# Patient Record
Sex: Female | Born: 1980 | Race: Black or African American | Hispanic: No | Marital: Single | Smoking: Never smoker
Health system: Southern US, Community
[De-identification: ages and names within clinical notes are randomized; demographics above are authoritative.]

## PROBLEM LIST (undated history)

## (undated) DIAGNOSIS — F419 Anxiety disorder, unspecified: Secondary | ICD-10-CM

## (undated) DIAGNOSIS — Z8489 Family history of other specified conditions: Secondary | ICD-10-CM

## (undated) DIAGNOSIS — D649 Anemia, unspecified: Secondary | ICD-10-CM

## (undated) DIAGNOSIS — K219 Gastro-esophageal reflux disease without esophagitis: Secondary | ICD-10-CM

## (undated) DIAGNOSIS — E119 Type 2 diabetes mellitus without complications: Secondary | ICD-10-CM

## (undated) HISTORY — PX: SUPERFICIAL LYMPH NODE BIOPSY / EXCISION: SUR127

## (undated) HISTORY — DX: Type 2 diabetes mellitus without complications: E11.9

## (undated) HISTORY — PX: ESOPHAGOGASTRODUODENOSCOPY: SHX1529

## (undated) HISTORY — PX: GYNECOLOGIC CRYOSURGERY: SHX857

## (undated) HISTORY — PX: CHOLECYSTECTOMY: SHX55

---

## 2003-09-21 ENCOUNTER — Emergency Department (HOSPITAL_COMMUNITY): Admission: EM | Admit: 2003-09-21 | Discharge: 2003-09-21 | Payer: Self-pay | Admitting: Emergency Medicine

## 2004-03-21 ENCOUNTER — Emergency Department (HOSPITAL_COMMUNITY): Admission: EM | Admit: 2004-03-21 | Discharge: 2004-03-21 | Payer: Self-pay | Admitting: Emergency Medicine

## 2004-03-21 ENCOUNTER — Emergency Department (HOSPITAL_COMMUNITY): Admission: EM | Admit: 2004-03-21 | Discharge: 2004-03-22 | Payer: Self-pay | Admitting: Emergency Medicine

## 2004-07-12 ENCOUNTER — Other Ambulatory Visit: Admission: RE | Admit: 2004-07-12 | Discharge: 2004-07-12 | Payer: Self-pay | Admitting: Obstetrics and Gynecology

## 2004-08-02 ENCOUNTER — Encounter: Admission: RE | Admit: 2004-08-02 | Discharge: 2004-08-02 | Payer: Self-pay | Admitting: Family Medicine

## 2004-08-30 ENCOUNTER — Other Ambulatory Visit: Admission: RE | Admit: 2004-08-30 | Discharge: 2004-08-30 | Payer: Self-pay | Admitting: Obstetrics and Gynecology

## 2005-01-17 ENCOUNTER — Other Ambulatory Visit: Admission: RE | Admit: 2005-01-17 | Discharge: 2005-01-17 | Payer: Self-pay | Admitting: Obstetrics and Gynecology

## 2011-11-25 ENCOUNTER — Encounter (HOSPITAL_BASED_OUTPATIENT_CLINIC_OR_DEPARTMENT_OTHER): Payer: Self-pay | Admitting: Student

## 2011-11-25 ENCOUNTER — Emergency Department (HOSPITAL_BASED_OUTPATIENT_CLINIC_OR_DEPARTMENT_OTHER)
Admission: EM | Admit: 2011-11-25 | Discharge: 2011-11-25 | Disposition: A | Discharge status: Home / Self Care | Payer: Self-pay | Attending: Emergency Medicine | Admitting: Emergency Medicine

## 2011-11-25 DIAGNOSIS — E119 Type 2 diabetes mellitus without complications: Secondary | ICD-10-CM | POA: Insufficient documentation

## 2011-11-25 DIAGNOSIS — N390 Urinary tract infection, site not specified: Secondary | ICD-10-CM | POA: Insufficient documentation

## 2011-11-25 HISTORY — DX: Anemia, unspecified: D64.9

## 2011-11-25 LAB — URINALYSIS, ROUTINE W REFLEX MICROSCOPIC
Bilirubin Urine: NEGATIVE
Glucose, UA: >1000 mg/dL — AB
Ketones, ur: NEGATIVE mg/dL
Nitrite: NEGATIVE
Protein, ur: NEGATIVE mg/dL
Specific Gravity, Urine: 1.04 — ABNORMAL HIGH (ref 1.005–1.030)
Urobilinogen, UA: 0.2 mg/dL (ref 0.0–1.0)
pH: 6 (ref 5.0–8.0)

## 2011-11-25 LAB — BASIC METABOLIC PANEL
BUN: 9 mg/dL (ref 6–23)
CO2: 24 mEq/L (ref 19–32)
Calcium: 9.7 mg/dL (ref 8.4–10.5)
Chloride: 99 mEq/L (ref 96–112)
Creatinine, Ser: 0.6 mg/dL (ref 0.50–1.10)
GFR calc Af Amer: >90 mL/min (ref >90)
GFR calc non Af Amer: >90 mL/min (ref >90)
Glucose, Bld: 258 mg/dL — ABNORMAL HIGH (ref 70–99)
Potassium: 3.7 mEq/L (ref 3.5–5.1)
Sodium: 135 mEq/L (ref 135–145)

## 2011-11-25 LAB — CBC
HCT: 37.3 % (ref 36.0–46.0)
Hemoglobin: 13.1 g/dL (ref 12.0–15.0)
MCH: 29.6 pg (ref 26.0–34.0)
MCHC: 35.1 g/dL (ref 30.0–36.0)
MCV: 84.4 fL (ref 78.0–100.0)
Platelets: 266 10*3/uL (ref 150–400)
RBC: 4.42 MIL/uL (ref 3.87–5.11)
RDW: 12.1 % (ref 11.5–15.5)
WBC: 8.9 10*3/uL (ref 4.0–10.5)

## 2011-11-25 LAB — PREGNANCY, URINE: Preg Test, Ur: NEGATIVE

## 2011-11-25 LAB — URINE MICROSCOPIC-ADD ON

## 2011-11-25 LAB — GLUCOSE, CAPILLARY: Glucose-Capillary: 254 mg/dL — ABNORMAL HIGH (ref 70–99)

## 2011-11-25 MED ORDER — SODIUM CHLORIDE 0.9 % IV BOLUS (SEPSIS)
1000.0000 mL | Freq: Once | INTRAVENOUS | Status: AC
  Administered 2011-11-25: 1000 mL via INTRAVENOUS

## 2011-11-25 MED ORDER — CIPROFLOXACIN HCL 500 MG PO TABS: 500.0000 mg | ORAL_TABLET | Freq: Two times a day (BID) | ORAL | Status: AC

## 2011-11-25 MED ORDER — METFORMIN HCL 500 MG PO TABS
500.0000 mg | ORAL_TABLET | Freq: Once | ORAL | Status: AC
  Administered 2011-11-25: 500 mg via ORAL
  Filled 2011-11-25: qty 1

## 2011-11-25 MED ORDER — METFORMIN HCL 500 MG PO TABS: 500.0000 mg | ORAL_TABLET | Freq: Once | ORAL | Status: DC

## 2011-11-25 MED ORDER — CIPROFLOXACIN HCL 500 MG PO TABS
500.0000 mg | ORAL_TABLET | Freq: Once | ORAL | Status: AC
  Administered 2011-11-25: 500 mg via ORAL
  Filled 2011-11-25: qty 1

## 2011-11-25 NOTE — ED Notes (Signed)
Pt educated on importance of checking her blood glucose at home while on glucophage, pt verbalized understanding and stated that she had access to several glucometers. Pt to follow up with pcp in 2 days

## 2011-11-25 NOTE — Discharge Instructions (Signed)

## 2011-11-25 NOTE — ED Notes (Addendum)
Pt in with c/o feeling foggy, tired, reports increased urination with dysuria, increased thirst and reports blurred vision. Pt in with reported CBG of 419.

## 2011-11-25 NOTE — ED Provider Notes (Signed)
History   This chart was scribed for Nancy Bucco, MD by Sofie Rower. The patient was seen in room MH10/MH10 and the patient's care was started at 9:10 PM     CSN: [id number]  Arrival date & time 11/25/11  1953   First MD Initiated Contact with Patient 11/25/11 2052      Chief Complaint  Patient presents with  . Hyperglycemia  . Eye Problem    blurred vision    (Consider location/radiation/quality/duration/timing/severity/associated sxs/prior treatment) HPI  Nancy Nunez is a 31 y.o. female who presents to the Emergency Department complaining of moderate, constant hyperglycemia onset three days ago with associated symptoms of nausea, headaches, thirst, increased urinary frequency. The pt states "she has never had any problems with her blood sugar before." Pt has a hx of anemia.   Pt denies fever, cough, congestion.   PCP is Dr. Coralee Pesa in [place], [place].    Past Medical History  Diagnosis Date  . Anemia     Past Surgical History  Procedure Date  . Superficial lymph node biopsy / excision       History  Substance Use Topics  . Smoking status: Never Smoker   . Smokeless tobacco: Not on file  . Alcohol Use: No    OB History    Grav Para Term Preterm Abortions TAB SAB Ect Mult Living                  Review of Systems  Constitutional: Negative for fever, chills, diaphoresis and fatigue.  HENT: Negative for congestion, rhinorrhea and sneezing.   Eyes: Negative.   Respiratory: Negative for cough, chest tightness and shortness of breath.   Cardiovascular: Negative for chest pain and leg swelling.  Gastrointestinal: Negative for nausea, vomiting, abdominal pain, diarrhea and blood in stool.  Genitourinary: Negative for frequency, hematuria, flank pain and difficulty urinating.  Musculoskeletal: Negative for back pain and arthralgias.  Skin: Negative for rash.  Neurological: Negative for dizziness, speech difficulty, weakness, numbness and headaches.     Allergies  Bee venom and Pineapple  Home Medications   Current Outpatient Rx  Name Route Sig Dispense Refill  . ACETAMINOPHEN 325 MG PO TABS Oral Take by mouth every 6 (six) hours as needed. Patient used this medication for her headache.    Marland Kitchen LORATADINE 10 MG PO TABS Oral Take 10 mg by mouth daily. Patient is using this medication for her allergies.    Marland Kitchen METFORMIN HCL 500 MG PO TABS Oral Take 1 tablet (500 mg total) by mouth once. 30 tablet 0    BP 131/74  Pulse 90  Temp 98.1 F (36.7 C) (Oral)  Resp 20  Wt 178 lb (80.74 kg)  SpO2 100%  LMP 11/02/2011  Physical Exam  Nursing note and vitals reviewed. Constitutional: She is oriented to person, place, and time. She appears well-developed and well-nourished.  HENT:  Head: Normocephalic and atraumatic.  Eyes: Pupils are equal, round, and reactive to light.  Neck: Normal range of motion. Neck supple.  Cardiovascular: Normal rate, regular rhythm and normal heart sounds.   Pulmonary/Chest: Effort normal and breath sounds normal. No respiratory distress. She has no wheezes. She has no rales. She exhibits no tenderness.  Abdominal: Soft. Bowel sounds are normal. There is no tenderness. There is no rebound and no guarding.  Musculoskeletal: Normal range of motion. She exhibits no edema.  Lymphadenopathy:    She has no cervical adenopathy.  Neurological: She is alert and oriented to person, place, and time.  Skin: Skin is warm and dry. No rash noted.  Psychiatric: She has a normal mood and affect.    ED Course  Procedures (including critical care time)  DIAGNOSTIC STUDIES: Oxygen Saturation is 100% on room air, normal by my interpretation.    COORDINATION OF CARE:  9:12PM- EDP at bedside discusses treatment plan concerning blood work and diabetes management.    Results for orders placed during the hospital encounter of 11/25/11  GLUCOSE, CAPILLARY      Component Value Range   Glucose-Capillary 254 (*) 70 - 99 mg/dL    Comment 1 Notify RN     Comment 2 Documented in Chart    CBC      Component Value Range   WBC 8.9  4.0 - 10.5 K/uL   RBC 4.42  3.87 - 5.11 MIL/uL   Hemoglobin 13.1  12.0 - 15.0 g/dL   HCT 16.1  09.6 - 04.5 %   MCV 84.4  78.0 - 100.0 fL   MCH 29.6  26.0 - 34.0 pg   MCHC 35.1  30.0 - 36.0 g/dL   RDW 40.9  81.1 - 91.4 %   Platelets 266  150 - 400 K/uL  BASIC METABOLIC PANEL      Component Value Range   Sodium 135  135 - 145 mEq/L   Potassium 3.7  3.5 - 5.1 mEq/L   Chloride 99  96 - 112 mEq/L   CO2 24  19 - 32 mEq/L   Glucose, Bld 258 (*) 70 - 99 mg/dL   BUN 9  6 - 23 mg/dL   Creatinine, Ser 7.82  0.50 - 1.10 mg/dL   Calcium 9.7  8.4 - 95.6 mg/dL   GFR calc non Af Amer >90  >90 mL/min   GFR calc Af Amer >90  >90 mL/min  URINALYSIS, ROUTINE W REFLEX MICROSCOPIC      Component Value Range   Color, Urine YELLOW  YELLOW   APPearance CLOUDY (*) CLEAR   Specific Gravity, Urine 1.040 (*) 1.005 - 1.030   pH 6.0  5.0 - 8.0   Glucose, UA >1000 (*) NEGATIVE mg/dL   Hgb urine dipstick TRACE (*) NEGATIVE   Bilirubin Urine NEGATIVE  NEGATIVE   Ketones, ur NEGATIVE  NEGATIVE mg/dL   Protein, ur NEGATIVE  NEGATIVE mg/dL   Urobilinogen, UA 0.2  0.0 - 1.0 mg/dL   Nitrite NEGATIVE  NEGATIVE   Leukocytes, UA MODERATE (*) NEGATIVE  PREGNANCY, URINE      Component Value Range   Preg Test, Ur NEGATIVE  NEGATIVE  URINE MICROSCOPIC-ADD ON      Component Value Range   Squamous Epithelial / LPF RARE  RARE   WBC, UA 11-20  <3 WBC/hpf   RBC / HPF 0-2  <3 RBC/hpf   Bacteria, UA FEW (*) RARE   No results found.    No results found.   1. Diabetes mellitus, new onset   2. UTI (lower urinary tract infection)       MDM  Pt with new onset diabetes, well appearing, no evidence of DKA.  Spoke with PA on call for her PMD, Dr. Coralee Pesa who will see pt in office within the next couple of days.  Will start pt on metformin per her recommendation.  Advised pt to check BS frequently over next few days,  has monitor at home due to family member with DM      I personally performed the services described in this documentation, which was scribed in  my presence.  The recorded information has been reviewed and considered.    Nancy Bucco, MD 11/25/11 2140

## 2013-09-16 ENCOUNTER — Ambulatory Visit: Payer: Self-pay

## 2013-09-23 ENCOUNTER — Ambulatory Visit: Payer: Self-pay

## 2013-10-07 ENCOUNTER — Ambulatory Visit (INDEPENDENT_AMBULATORY_CARE_PROVIDER_SITE_OTHER): Payer: BC Managed Care – PPO

## 2013-10-07 ENCOUNTER — Encounter: Payer: Self-pay | Admitting: *Deleted

## 2013-10-07 VITALS — BP 120/76 | HR 87 | Resp 12

## 2013-10-07 DIAGNOSIS — E1142 Type 2 diabetes mellitus with diabetic polyneuropathy: Secondary | ICD-10-CM

## 2013-10-07 DIAGNOSIS — R52 Pain, unspecified: Secondary | ICD-10-CM

## 2013-10-07 DIAGNOSIS — G5761 Lesion of plantar nerve, right lower limb: Secondary | ICD-10-CM

## 2013-10-07 DIAGNOSIS — G5760 Lesion of plantar nerve, unspecified lower limb: Secondary | ICD-10-CM

## 2013-10-07 DIAGNOSIS — E1149 Type 2 diabetes mellitus with other diabetic neurological complication: Secondary | ICD-10-CM

## 2013-10-07 DIAGNOSIS — E1140 Type 2 diabetes mellitus with diabetic neuropathy, unspecified: Secondary | ICD-10-CM

## 2013-10-07 MED ORDER — MELOXICAM 15 MG PO TABS: 15.0000 mg | ORAL_TABLET | Freq: Every day | ORAL | Status: DC

## 2013-10-07 NOTE — Patient Instructions (Signed)
ICE INSTRUCTIONS  Apply ice or cold pack to the affected area at least 3 times a day for 10-15 minutes each time.  You should also use ice after prolonged activity or vigorous exercise.  Do not apply ice longer than 20 minutes at one time.  Always keep a cloth between your skin and the ice pack to prevent burns.  Being consistent and following these instructions will help control your symptoms.  We suggest you purchase a gel ice pack because they are reusable and do bit leak.  Some of them are designed to wrap around the area.  Use the method that works best for you.  Here are some other suggestions for icing.   Use a frozen bag of peas or corn-inexpensive and molds well to your body, usually stays frozen for 10 to 20 minutes.  Wet a towel with cold water and squeeze out the excess until it's damp.  Place in a bag in the freezer for 20 minutes. Then remove and use.    Diabetes and Foot Care Diabetes may cause you to have problems because of poor blood supply (circulation) to your feet and legs. This may cause the skin on your feet to become thinner, break easier, and heal more slowly. Your skin may become dry, and the skin may peel and crack. You may also have nerve damage in your legs and feet causing decreased feeling in them. You may not notice minor injuries to your feet that could lead to infections or more serious problems. Taking care of your feet is one of the most important things you can do for yourself.  HOME CARE INSTRUCTIONS  Wear shoes at all times, even in the house. Do not go barefoot. Bare feet are easily injured.  Check your feet daily for blisters, cuts, and redness. If you cannot see the bottom of your feet, use a mirror or ask someone for help.  Wash your feet with warm water (do not use hot water) and mild soap. Then pat your feet and the areas between your toes until they are completely dry. Do not soak your feet as this can dry your skin.  Apply a moisturizing lotion or  petroleum jelly (that does not contain alcohol and is unscented) to the skin on your feet and to dry, brittle toenails. Do not apply lotion between your toes.  Trim your toenails straight across. Do not dig under them or around the cuticle. File the edges of your nails with an emery board or nail file.  Do not cut corns or calluses or try to remove them with medicine.  Wear clean socks or stockings every day. Make sure they are not too tight. Do not wear knee-high stockings since they may decrease blood flow to your legs.  Wear shoes that fit properly and have enough cushioning. To break in new shoes, wear them for just a few hours a day. This prevents you from injuring your feet. Always look in your shoes before you put them on to be sure there are no objects inside.  Do not cross your legs. This may decrease the blood flow to your feet.  If you find a minor scrape, cut, or break in the skin on your feet, keep it and the skin around it clean and dry. These areas may be cleansed with mild soap and water. Do not cleanse the area with peroxide, alcohol, or iodine.  When you remove an adhesive bandage, be sure not to damage the skin around  it.  If you have a wound, look at it several times a day to make sure it is healing.  Do not use heating pads or hot water bottles. They may burn your skin. If you have lost feeling in your feet or legs, you may not know it is happening until it is too late.  Make sure your health care provider performs a complete foot exam at least annually or more often if you have foot problems. Report any cuts, sores, or bruises to your health care provider immediately. SEEK MEDICAL CARE IF:   You have an injury that is not healing.  You have cuts or breaks in the skin.  You have an ingrown nail.  You notice redness on your legs or feet.  You feel burning or tingling in your legs or feet.  You have pain or cramps in your legs and feet.  Your legs or feet are  numb.  Your feet always feel cold. SEEK IMMEDIATE MEDICAL CARE IF:   There is increasing redness, swelling, or pain in or around a wound.  There is a red line that goes up your leg.  Pus is coming from a wound.  You develop a fever or as directed by your health care provider.  You notice a bad smell coming from an ulcer or wound. Document Released: 05/24/2000 Document Revised: 01/27/2013 Document Reviewed: 11/03/2012 [facility] Patient Information 2014 [place].  Shoe recommendations maintain a stiff or firm soled athletic shoe lace up oxford type shoe avoid any flimsy shoes barefoot or flip-flops. Crocs make an excellent shoe for around the house

## 2013-10-07 NOTE — Progress Notes (Signed)
   Subjective:    Patient ID: Nancy Nunez, female    DOB: [birth year], 33 y.o.   MRN: [id number]  HPI  PT STATED RT BALL OF THE FOOT HAVING BURNING SENSATION AND PAINFUL FOR 5 MONTHS. THE FOOT IS MUCH BETTER. THE FOOT GET AGGRAVATED BY WALKING AND PUTTING PRESSURE ON IT. TRIED IBUPROFEN AND GOOD SHOES AND IT HELPS.    Review of Systems  All other systems reviewed and are negative.      Objective:   Physical Exam 33 year old [place] American female presents this time with a six-month history of pain burning in the ball of the right foot motion objective findings as follows well-developed well-nourished returns 3 has palpable pedal pulses DP postal for PT plus one over 4 capillary refill time 3 seconds all digits skin temperature warm turgor normal no edema rubor pallor or varicosities noted. Neurologically epicritic and proprioceptive sensations intact and symmetric bilateral there is normal plantar response DTRs not elicited dermatologically skin color pigment normal orthopedic biomechanical exam there is pain on palpation second MTP and second interspace right on direct lateral compression x-rays reveal signs of fracture no osseous abnormality mild flexible digital contractures are noted. Neurologically patient may have some early diabetic neuropathy symptoms however burning of the right facility exacerbation at this time contralateral left foot is asymptomatic. Patient is currently wearing a very flimsy and soled ballet take slipper that has no structure support and is at least 1 inch to narrow for her foot     Assessment & Plan:  Assessment this time is possible mild early diabetic neuropathy however exacerbation of second interspace Morton's neuroma is evident on direct lateral compression. This time injection tender with Kenalog 20 mg Xylocaine plain infiltrated to the second intermetatarsal space providing some relief recommend ice and meloxicam or MOBIC NSAID therapy reappointed in 3-4  weeks for followup recommend maintain wide stiff soled shoe at all times no barefoot or flimsy shoes or tight constrictive shoes. Next  Harriet Masson DPM

## 2013-10-14 ENCOUNTER — Ambulatory Visit: Payer: BC Managed Care – PPO

## 2013-10-18 ENCOUNTER — Telehealth: Payer: Self-pay | Admitting: *Deleted

## 2013-10-18 ENCOUNTER — Encounter: Payer: Self-pay | Admitting: *Deleted

## 2013-10-18 NOTE — Telephone Encounter (Signed)
He put me on light duty for a week.  My foot has gotten worse.  Please call me, I have 2 questions.

## 2013-10-18 NOTE — Telephone Encounter (Signed)
Called patient to see if I could help her and patient stated that someone has already helped her with the questions/lisa cox

## 2013-10-27 ENCOUNTER — Encounter: Payer: Self-pay | Admitting: *Deleted

## 2013-10-27 ENCOUNTER — Ambulatory Visit (INDEPENDENT_AMBULATORY_CARE_PROVIDER_SITE_OTHER): Payer: BC Managed Care – PPO

## 2013-10-27 VITALS — BP 110/62 | HR 96 | Resp 18

## 2013-10-27 DIAGNOSIS — R52 Pain, unspecified: Secondary | ICD-10-CM

## 2013-10-27 DIAGNOSIS — E1140 Type 2 diabetes mellitus with diabetic neuropathy, unspecified: Secondary | ICD-10-CM

## 2013-10-27 DIAGNOSIS — G5761 Lesion of plantar nerve, right lower limb: Secondary | ICD-10-CM

## 2013-10-27 DIAGNOSIS — G5760 Lesion of plantar nerve, unspecified lower limb: Secondary | ICD-10-CM

## 2013-10-27 MED ORDER — GABAPENTIN 300 MG PO CAPS: 300.0000 mg | ORAL_CAPSULE | Freq: Every day | ORAL | Status: DC

## 2013-10-27 NOTE — Patient Instructions (Signed)

## 2013-10-27 NOTE — Progress Notes (Signed)
   Subjective:    Patient ID: Nancy Nunez, female    DOB: 1981-01-29, 33 y.o.   MRN: [id number]  HPI last week was bad on my right foot and this week is a little better but not well and I have iced it and kept it up and it seems to have become sensitive on top of my foot    Review of Systems no new systemic changes or findings to     Objective:   Physical Exam Neurovascular status is intact pedal pulses are palpable epicritic and proprioceptive sensations intact patient has hyperesthesia of the right foot of all interspace and 00 forefoot in general second third and fourth interspaces tender on direct lateral compression consistent with neuroma versus diabetic peripheral neuropathy. Left foot is relatively asymptomatic patient is actually recently put on the medications which may be causing the neuropathy exacerbation or exacerbation of her neuroma patient also brought in her tennis shoes she is wearing [employer] classic which is in a curved last and may be causing some aggravation the shoes are also too narrow for foot recommended a wider shoe with a straight last such as a new balance or Brooks. The injection provide some temporary relief however has flared back up since then continues to have paresthesias burning and shooting sensation in her right foot.       Assessment & Plan:  Assessment diabetic peripheral neuropathy as well as possibly exacerbation of early Morton's neuroma right foot second third interspaces plan at this time patient will continue with warm compress ice pack application as recommended at this time using heat and cold as well as recommended wider accommodative shoes avoiding concur plan inferolateral such as the reduction is wearing a return to work activities for exercise swimming is recommended if available. Patient also at this time is placed on a trial of gabapentin 3 mg each bedtime recheck in one month for followup and reevaluation discussed the risks indications  alternatives side effects or medications she understands the the sleeping a side effect indicates her mother was done Lyrica and had suicidal thoughts advising assessment possible potential side effect In an individual and she is to watch for in symptomology. Followup in one month as recommended  Harriet Masson DPM

## 2013-11-25 ENCOUNTER — Ambulatory Visit: Payer: BC Managed Care – PPO

## 2014-11-01 ENCOUNTER — Telehealth: Payer: Self-pay | Admitting: *Deleted

## 2014-11-01 NOTE — Telephone Encounter (Signed)
We are working on getting a cd for the patient. Nancy Nunez

## 2019-01-04 DIAGNOSIS — E1165 Type 2 diabetes mellitus with hyperglycemia: Secondary | ICD-10-CM

## 2019-01-04 HISTORY — DX: Type 2 diabetes mellitus with hyperglycemia: E11.65

## 2020-10-17 ENCOUNTER — Other Ambulatory Visit: Payer: Self-pay | Admitting: Anesthesiology

## 2020-10-17 DIAGNOSIS — G8911 Acute pain due to trauma: Secondary | ICD-10-CM

## 2020-11-07 ENCOUNTER — Ambulatory Visit (HOSPITAL_COMMUNITY)
Admission: RE | Admit: 2020-11-07 | Discharge: 2020-11-07 | Disposition: A | Discharge status: Home / Self Care | Payer: Commercial Managed Care - PPO | Source: Ambulatory Visit | Attending: Anesthesiology | Admitting: Anesthesiology

## 2020-11-07 ENCOUNTER — Other Ambulatory Visit: Payer: Self-pay

## 2020-11-07 DIAGNOSIS — G8911 Acute pain due to trauma: Secondary | ICD-10-CM | POA: Diagnosis present

## 2020-12-08 DIAGNOSIS — U071 COVID-19: Secondary | ICD-10-CM | POA: Insufficient documentation

## 2020-12-08 HISTORY — DX: COVID-19: U07.1

## 2021-04-09 ENCOUNTER — Other Ambulatory Visit: Payer: Self-pay | Admitting: *Deleted

## 2021-04-09 ENCOUNTER — Encounter: Payer: Self-pay | Admitting: *Deleted

## 2021-04-09 ENCOUNTER — Ambulatory Visit (INDEPENDENT_AMBULATORY_CARE_PROVIDER_SITE_OTHER): Payer: Commercial Managed Care - PPO

## 2021-04-09 DIAGNOSIS — R002 Palpitations: Secondary | ICD-10-CM

## 2021-04-09 NOTE — Progress Notes (Unsigned)
Patient ID: Nancy Nunez, female   DOB: 1981/03/05, 40 y.o.   MRN: [id number] Patient enrolled for [employer] to mail a 14 day ZIO XT monitor to her address on file. Letter with instructions mailed to patient.

## 2021-04-09 NOTE — Progress Notes (Unsigned)
Patient enrolled for [employer] to mail a 14 day ZIO XT monitor to her address on file. Letter with instructions mailed to patient.

## 2021-04-13 DIAGNOSIS — R002 Palpitations: Secondary | ICD-10-CM | POA: Diagnosis not present

## 2021-06-26 ENCOUNTER — Other Ambulatory Visit: Payer: Self-pay | Admitting: Internal Medicine

## 2021-06-26 ENCOUNTER — Encounter: Payer: Self-pay | Admitting: *Deleted

## 2021-06-26 DIAGNOSIS — R002 Palpitations: Secondary | ICD-10-CM

## 2021-06-26 NOTE — Progress Notes (Signed)
Patient ID: Nancy Nunez, female   DOB: 1980/12/25, 41 y.o.   MRN: [id number] Patient enrolled for [employer] to ship a 30 day cardiac event monitor to her address ,([address]), on file. Letter with instructions mailed to patient.

## 2021-09-19 NOTE — Progress Notes (Signed)
Event Monitor was never applied/canceled. Order will be canceled out of the workque

## 2021-10-04 ENCOUNTER — Other Ambulatory Visit: Payer: Self-pay | Admitting: Otolaryngology

## 2021-10-04 DIAGNOSIS — D37030 Neoplasm of uncertain behavior of the parotid salivary glands: Secondary | ICD-10-CM

## 2021-10-29 ENCOUNTER — Telehealth: Payer: Self-pay | Admitting: Otolaryngology

## 2021-10-29 NOTE — Telephone Encounter (Signed)
10/29/21~Per [facility], order is incorrect...need CT w / Called office, s/w Emily/[phone] (direct #)will fax corrected order. MF

## 2021-10-31 ENCOUNTER — Ambulatory Visit: Admission: RE | Admit: 2021-10-31 | Payer: Commercial Managed Care - PPO | Source: Ambulatory Visit

## 2021-11-12 ENCOUNTER — Ambulatory Visit: Payer: Commercial Managed Care - PPO

## 2021-11-16 ENCOUNTER — Ambulatory Visit
Admission: RE | Admit: 2021-11-16 | Discharge: 2021-11-16 | Disposition: A | Discharge status: Home / Self Care | Payer: Commercial Managed Care - PPO | Source: Ambulatory Visit | Attending: Otolaryngology | Admitting: Otolaryngology

## 2021-11-16 DIAGNOSIS — D37030 Neoplasm of uncertain behavior of the parotid salivary glands: Secondary | ICD-10-CM | POA: Diagnosis present

## 2021-11-16 LAB — POCT I-STAT CREATININE: Creatinine, Ser: 0.5 mg/dL (ref 0.44–1.00)

## 2021-11-16 MED ORDER — IOHEXOL 300 MG/ML  SOLN
75.0000 mL | Freq: Once | INTRAMUSCULAR | Status: AC | PRN
  Administered 2021-11-16: 75 mL via INTRAVENOUS

## 2021-11-28 ENCOUNTER — Other Ambulatory Visit: Payer: Self-pay | Admitting: Otolaryngology

## 2021-11-28 DIAGNOSIS — K118 Other diseases of salivary glands: Secondary | ICD-10-CM

## 2021-11-30 ENCOUNTER — Other Ambulatory Visit: Payer: Self-pay | Admitting: Physician Assistant

## 2021-12-04 ENCOUNTER — Ambulatory Visit
Admission: RE | Admit: 2021-12-04 | Discharge: 2021-12-04 | Disposition: A | Discharge status: Home / Self Care | Payer: Commercial Managed Care - PPO | Source: Ambulatory Visit | Attending: Otolaryngology | Admitting: Otolaryngology

## 2021-12-04 ENCOUNTER — Other Ambulatory Visit: Payer: Self-pay | Admitting: Otolaryngology

## 2021-12-04 DIAGNOSIS — D37030 Neoplasm of uncertain behavior of the parotid salivary glands: Secondary | ICD-10-CM | POA: Diagnosis present

## 2021-12-04 DIAGNOSIS — K118 Other diseases of salivary glands: Secondary | ICD-10-CM

## 2021-12-04 NOTE — Procedures (Signed)
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  Three core samples obtained and sent to Pathology.  Jodi Marble. Fredia Sorrow, M.D Pager:  [phone]

## 2021-12-05 LAB — SURGICAL PATHOLOGY

## 2022-02-07 ENCOUNTER — Encounter
Admission: RE | Admit: 2022-02-07 | Discharge: 2022-02-07 | Disposition: A | Discharge status: Home / Self Care | Payer: Commercial Managed Care - PPO | Source: Ambulatory Visit | Attending: Otolaryngology | Admitting: Otolaryngology

## 2022-02-07 VITALS — Ht 64.5 in | Wt 180.0 lb

## 2022-02-07 DIAGNOSIS — Z01818 Encounter for other preprocedural examination: Secondary | ICD-10-CM

## 2022-02-07 DIAGNOSIS — E119 Type 2 diabetes mellitus without complications: Secondary | ICD-10-CM

## 2022-02-07 HISTORY — DX: Anxiety disorder, unspecified: F41.9

## 2022-02-07 HISTORY — DX: Gastro-esophageal reflux disease without esophagitis: K21.9

## 2022-02-07 HISTORY — DX: Family history of other specified conditions: Z84.89

## 2022-02-07 NOTE — Patient Instructions (Signed)
Your procedure is scheduled on: 02/13/22 Report to [facility]. To find out your arrival time please call [phone] between 1PM - 3PM on 02/12/22.  Remember: Instructions that are not followed completely may result in serious medical risk, up to and including death, or upon the discretion of your surgeon and anesthesiologist your surgery may need to be rescheduled.     _X__ 1. Do not eat food after midnight the night before your procedure.                 No gum chewing or hard candies. You may drink clear liquids up to 2 hours                 before you are scheduled to arrive for your surgery- DO not drink clear                 liquids within 2 hours of the start of your surgery.                 Diabetics water only  __X__2.  On the morning of surgery brush your teeth with toothpaste and water, you                 may rinse your mouth with mouthwash if you wish.  Do not swallow any              toothpaste of mouthwash.     _X__ 3.  No Alcohol for 24 hours before or after surgery.   _X__ 4.  Do Not Smoke or use e-cigarettes For 24 Hours Prior to Your Surgery.                 Do not use any chewable tobacco products for at least 6 hours prior to                 surgery.  ____  5.  Bring all medications with you on the day of surgery if instructed.   __X__  6.  Notify your doctor if there is any change in your medical condition      (cold, fever, infections).     Do not wear jewelry, make-up, hairpins, clips or nail polish. Do not wear lotions, powders, or perfumes.  Do not shave 48 hours prior to surgery. Men may shave face and neck. Do not bring valuables to the hospital.    [facility] is not responsible for any belongings or valuables.  Contacts, dentures/partials or body piercings may not be worn into surgery. Bring a case for your contacts, glasses or hearing aids, a denture cup will be supplied. Leave your suitcase in the  car. After surgery it may be brought to your room. For patients admitted to the hospital, discharge time is determined by your treatment team.   Patients discharged the day of surgery will not be allowed to drive home.    __X__ Take these medicines the morning of surgery with A SIP OF WATER:    1. famotidine (PEPCID) 20 MG tablet  2.   3.   4.  5.  6.  ____ Fleet Enema (as directed)   ____ Use CHG Soap/SAGE wipes as directed  __X__ Use inhalers on the day of surgery  __x__ Stop metformin/Janumet/Farxiga 2 days prior to surgery   __X__ Take 1/2 of usual insulin dose the night before surgery. Decrease Lantus to 18 units the night before surgery No insulin  the morning          of surgery.   ____ Stop Blood Thinners Coumadin/Plavix/Xarelto/Pleta/Pradaxa/Eliquis/Effient/Aspirin  on   Or contact your Surgeon, Cardiologist or Medical Doctor regarding  ability to stop your blood thinners  __X__ Stop Anti-inflammatories 7 days before surgery such as Advil, Ibuprofen, Motrin,  BC or Goodies Powder, Naprosyn, Naproxen, Aleve, Aspirin    __X__ Stop all herbals and supplements, fish oil or vitamins for 7 days until after surgery.    ____ Bring C-Pap to the hospital.    Stop Ozempic 7 days before surgery

## 2022-02-13 ENCOUNTER — Ambulatory Visit: Payer: Commercial Managed Care - PPO | Admitting: Certified Registered"

## 2022-02-13 ENCOUNTER — Other Ambulatory Visit: Payer: Self-pay

## 2022-02-13 ENCOUNTER — Observation Stay
Admission: RE | Admit: 2022-02-13 | Discharge: 2022-02-14 | Disposition: A | Discharge status: Home / Self Care | Payer: Commercial Managed Care - PPO | Attending: Otolaryngology | Admitting: Otolaryngology

## 2022-02-13 ENCOUNTER — Encounter: Payer: Self-pay | Admitting: Otolaryngology

## 2022-02-13 ENCOUNTER — Encounter
Admission: RE | Disposition: A | Discharge status: Home / Self Care | Payer: Self-pay | Source: Home / Self Care | Attending: Otolaryngology

## 2022-02-13 DIAGNOSIS — Z01818 Encounter for other preprocedural examination: Secondary | ICD-10-CM

## 2022-02-13 DIAGNOSIS — Z794 Long term (current) use of insulin: Secondary | ICD-10-CM

## 2022-02-13 DIAGNOSIS — D37030 Neoplasm of uncertain behavior of the parotid salivary glands: Secondary | ICD-10-CM | POA: Diagnosis present

## 2022-02-13 DIAGNOSIS — Z8616 Personal history of COVID-19: Secondary | ICD-10-CM | POA: Insufficient documentation

## 2022-02-13 DIAGNOSIS — Z91040 Latex allergy status: Secondary | ICD-10-CM | POA: Insufficient documentation

## 2022-02-13 DIAGNOSIS — Z91010 Allergy to peanuts: Secondary | ICD-10-CM | POA: Insufficient documentation

## 2022-02-13 DIAGNOSIS — E119 Type 2 diabetes mellitus without complications: Secondary | ICD-10-CM | POA: Diagnosis not present

## 2022-02-13 DIAGNOSIS — D351 Benign neoplasm of parathyroid gland: Principal | ICD-10-CM | POA: Insufficient documentation

## 2022-02-13 HISTORY — PX: PAROTIDECTOMY: SHX2163

## 2022-02-13 HISTORY — DX: Neoplasm of uncertain behavior of the parotid salivary glands: D37.030

## 2022-02-13 LAB — GLUCOSE, CAPILLARY
Glucose-Capillary: 156 mg/dL — ABNORMAL HIGH (ref 70–99)
Glucose-Capillary: 202 mg/dL — ABNORMAL HIGH (ref 70–99)

## 2022-02-13 LAB — POCT PREGNANCY, URINE: Preg Test, Ur: NEGATIVE

## 2022-02-13 SURGERY — EXCISION, PAROTID GLAND
Anesthesia: General | Site: Face | Laterality: Left

## 2022-02-13 MED ORDER — ONDANSETRON HCL 4 MG/2ML IJ SOLN
INTRAMUSCULAR | Status: DC | PRN
  Administered 2022-02-13: 65 mg via INTRAVENOUS

## 2022-02-13 MED ORDER — INSULIN GLARGINE-YFGN 100 UNIT/ML ~~LOC~~ SOLN
36.0000 [IU] | Freq: Every day | SUBCUTANEOUS | Status: DC
  Administered 2022-02-13: 36 [IU] via SUBCUTANEOUS
  Filled 2022-02-13: qty 0.36

## 2022-02-13 MED ORDER — LIDOCAINE-EPINEPHRINE (PF) 1 %-1:200000 IJ SOLN
INTRAMUSCULAR | Status: DC | PRN
  Administered 2022-02-13: 10 mL

## 2022-02-13 MED ORDER — HYDROCODONE-ACETAMINOPHEN 5-325 MG PO TABS
1.0000 | ORAL_TABLET | ORAL | Status: DC | PRN
  Administered 2022-02-13 – 2022-02-14 (×2): 1 via ORAL
  Filled 2022-02-13 (×2): qty 1

## 2022-02-13 MED ORDER — PHENYLEPHRINE 80 MCG/ML (10ML) SYRINGE FOR IV PUSH (FOR BLOOD PRESSURE SUPPORT): PREFILLED_SYRINGE | INTRAVENOUS | Status: DC | PRN

## 2022-02-13 MED ORDER — PROPOFOL 10 MG/ML IV BOLUS
INTRAVENOUS | Status: DC | PRN
  Administered 2022-02-13: 150 mg via INTRAVENOUS

## 2022-02-13 MED ORDER — PROPOFOL 1000 MG/100ML IV EMUL
INTRAVENOUS | Status: AC
  Filled 2022-02-13: qty 100

## 2022-02-13 MED ORDER — ONDANSETRON HCL 4 MG/2ML IJ SOLN
4.0000 mg | Freq: Four times a day (QID) | INTRAMUSCULAR | Status: DC | PRN
  Administered 2022-02-13: 4 mg via INTRAVENOUS
  Filled 2022-02-13: qty 2

## 2022-02-13 MED ORDER — ALBUTEROL SULFATE (2.5 MG/3ML) 0.083% IN NEBU: 3.0000 mL | INHALATION_SOLUTION | Freq: Four times a day (QID) | RESPIRATORY_TRACT | Status: DC | PRN

## 2022-02-13 MED ORDER — LACTATED RINGERS IV SOLN: INTRAVENOUS | Status: DC | PRN

## 2022-02-13 MED ORDER — FENTANYL CITRATE (PF) 100 MCG/2ML IJ SOLN
25.0000 ug | INTRAMUSCULAR | Status: DC | PRN
  Administered 2022-02-13 (×3): 25 ug via INTRAVENOUS

## 2022-02-13 MED ORDER — MAGNESIUM HYDROXIDE 400 MG/5ML PO SUSP: 30.0000 mL | Freq: Every day | ORAL | Status: DC | PRN

## 2022-02-13 MED ORDER — CHLORHEXIDINE GLUCONATE 0.12 % MT SOLN
15.0000 mL | Freq: Once | OROMUCOSAL | Status: AC
Start: 2022-02-13 — End: 2022-02-13

## 2022-02-13 MED ORDER — ACETAMINOPHEN 650 MG RE SUPP: 650.0000 mg | Freq: Four times a day (QID) | RECTAL | Status: DC | PRN

## 2022-02-13 MED ORDER — CYCLOBENZAPRINE HCL 10 MG PO TABS: 10.0000 mg | ORAL_TABLET | Freq: Three times a day (TID) | ORAL | Status: DC | PRN

## 2022-02-13 MED ORDER — LIDOCAINE HCL (CARDIAC) PF 100 MG/5ML IV SOSY
PREFILLED_SYRINGE | INTRAVENOUS | Status: DC | PRN
  Administered 2022-02-13: 50 mg via INTRAVENOUS

## 2022-02-13 MED ORDER — FENTANYL CITRATE (PF) 100 MCG/2ML IJ SOLN
INTRAMUSCULAR | Status: AC
  Filled 2022-02-13: qty 2

## 2022-02-13 MED ORDER — HEMOSTATIC AGENTS (NO CHARGE) OPTIME
TOPICAL | Status: DC | PRN
  Administered 2022-02-13: 1 via TOPICAL

## 2022-02-13 MED ORDER — FAMOTIDINE 20 MG PO TABS
ORAL_TABLET | ORAL | Status: AC
  Administered 2022-02-13: 20 mg via ORAL
  Filled 2022-02-13: qty 1

## 2022-02-13 MED ORDER — BISACODYL 5 MG PO TBEC: 5.0000 mg | DELAYED_RELEASE_TABLET | Freq: Every day | ORAL | Status: DC | PRN

## 2022-02-13 MED ORDER — ONDANSETRON 4 MG PO TBDP: 4.0000 mg | ORAL_TABLET | Freq: Four times a day (QID) | ORAL | Status: DC | PRN

## 2022-02-13 MED ORDER — KETAMINE HCL 50 MG/5ML IJ SOSY
PREFILLED_SYRINGE | INTRAMUSCULAR | Status: AC
Start: 2022-02-13 — End: ?
  Filled 2022-02-13: qty 5

## 2022-02-13 MED ORDER — FENTANYL CITRATE (PF) 100 MCG/2ML IJ SOLN
INTRAMUSCULAR | Status: AC
  Administered 2022-02-13: 25 ug via INTRAVENOUS
  Filled 2022-02-13: qty 2

## 2022-02-13 MED ORDER — LIDOCAINE-EPINEPHRINE (PF) 1 %-1:200000 IJ SOLN
INTRAMUSCULAR | Status: AC
  Filled 2022-02-13: qty 30

## 2022-02-13 MED ORDER — SODIUM CHLORIDE 0.45 % IV SOLN: INTRAVENOUS | Status: DC

## 2022-02-13 MED ORDER — FLEET ENEMA 7-19 GM/118ML RE ENEM: 1.0000 | ENEMA | Freq: Once | RECTAL | Status: DC | PRN

## 2022-02-13 MED ORDER — FAMOTIDINE 20 MG PO TABS
20.0000 mg | ORAL_TABLET | Freq: Every day | ORAL | Status: DC
  Administered 2022-02-13: 20 mg via ORAL
  Filled 2022-02-13: qty 1

## 2022-02-13 MED ORDER — SODIUM CHLORIDE 0.9 % IV SOLN: INTRAVENOUS | Status: DC

## 2022-02-13 MED ORDER — FENTANYL CITRATE (PF) 100 MCG/2ML IJ SOLN
INTRAMUSCULAR | Status: DC | PRN
  Administered 2022-02-13 (×3): 50 ug via INTRAVENOUS

## 2022-02-13 MED ORDER — FAMOTIDINE 20 MG PO TABS: 20.0000 mg | ORAL_TABLET | Freq: Once | ORAL | Status: AC

## 2022-02-13 MED ORDER — PHENYLEPHRINE HCL (PRESSORS) 10 MG/ML IV SOLN
INTRAVENOUS | Status: DC | PRN
  Administered 2022-02-13 (×5): 160 ug via INTRAVENOUS

## 2022-02-13 MED ORDER — METFORMIN HCL 500 MG PO TABS: 500.0000 mg | ORAL_TABLET | Freq: Every day | ORAL | Status: DC

## 2022-02-13 MED ORDER — DEXAMETHASONE SODIUM PHOSPHATE 10 MG/ML IJ SOLN
INTRAMUSCULAR | Status: DC | PRN
  Administered 2022-02-13: 10 mg via INTRAVENOUS

## 2022-02-13 MED ORDER — 0.9 % SODIUM CHLORIDE (POUR BTL) OPTIME
TOPICAL | Status: DC | PRN
  Administered 2022-02-13: 500 mL

## 2022-02-13 MED ORDER — PROCHLORPERAZINE EDISYLATE 10 MG/2ML IJ SOLN
5.0000 mg | Freq: Four times a day (QID) | INTRAMUSCULAR | Status: DC | PRN
  Administered 2022-02-13: 5 mg via INTRAVENOUS
  Filled 2022-02-13: qty 2

## 2022-02-13 MED ORDER — ALPRAZOLAM 0.25 MG PO TABS: 0.2500 mg | ORAL_TABLET | Freq: Every day | ORAL | Status: DC | PRN

## 2022-02-13 MED ORDER — MIDAZOLAM HCL 2 MG/2ML IJ SOLN
INTRAMUSCULAR | Status: DC | PRN
  Administered 2022-02-13: 2 mg via INTRAVENOUS

## 2022-02-13 MED ORDER — ORAL CARE MOUTH RINSE: 15.0000 mL | OROMUCOSAL | Status: DC | PRN

## 2022-02-13 MED ORDER — BACITRACIN ZINC 500 UNIT/GM EX OINT
TOPICAL_OINTMENT | CUTANEOUS | Status: AC
  Filled 2022-02-13: qty 28.35

## 2022-02-13 MED ORDER — MORPHINE SULFATE (PF) 4 MG/ML IV SOLN: 3.0000 mg | INTRAVENOUS | Status: DC | PRN

## 2022-02-13 MED ORDER — SUCCINYLCHOLINE CHLORIDE 200 MG/10ML IV SOSY
PREFILLED_SYRINGE | INTRAVENOUS | Status: DC | PRN
  Administered 2022-02-13: 100 mg via INTRAVENOUS

## 2022-02-13 MED ORDER — ORAL CARE MOUTH RINSE
15.0000 mL | Freq: Once | OROMUCOSAL | Status: AC
Start: 2022-02-13 — End: 2022-02-13

## 2022-02-13 MED ORDER — ACETAMINOPHEN 325 MG PO TABS: 650.0000 mg | ORAL_TABLET | Freq: Four times a day (QID) | ORAL | Status: DC | PRN

## 2022-02-13 MED ORDER — CHLORHEXIDINE GLUCONATE 0.12 % MT SOLN
OROMUCOSAL | Status: AC
  Administered 2022-02-13: 15 mL via OROMUCOSAL
  Filled 2022-02-13: qty 15

## 2022-02-13 MED ORDER — PHENYLEPHRINE HCL-NACL 20-0.9 MG/250ML-% IV SOLN
INTRAVENOUS | Status: DC | PRN
  Administered 2022-02-13: 50 ug/min via INTRAVENOUS

## 2022-02-13 MED ORDER — ONDANSETRON HCL 4 MG/2ML IJ SOLN: 4.0000 mg | Freq: Once | INTRAMUSCULAR | Status: DC | PRN

## 2022-02-13 MED ORDER — KETAMINE HCL 10 MG/ML IJ SOLN
INTRAMUSCULAR | Status: DC | PRN
  Administered 2022-02-13: 15 mg via INTRAVENOUS
  Administered 2022-02-13: 25 mg via INTRAVENOUS

## 2022-02-13 MED ORDER — MIDAZOLAM HCL 2 MG/2ML IJ SOLN
INTRAMUSCULAR | Status: AC
  Filled 2022-02-13: qty 2

## 2022-02-13 SURGICAL SUPPLY — 43 items
BLADE SURG 15 STRL LF DISP TIS (BLADE) ×1 IMPLANT
BLADE SURG 15 STRL SS (BLADE) ×1
CORD BIP STRL DISP 12FT (MISCELLANEOUS) ×1 IMPLANT
DRAIN JP 10F RND SILICONE (MISCELLANEOUS) ×1 IMPLANT
DRAPE INCISE 23X17 IOBAN STRL (DRAPES) ×2
DRAPE INCISE 23X17 STRL (DRAPES) ×2 IMPLANT
DRAPE INCISE IOBAN 23X17 STRL (DRAPES) ×2 IMPLANT
DRAPE MAG INST 16X20 L/F (DRAPES) ×1 IMPLANT
DRSG TEGADERM 2-3/8X2-3/4 SM (GAUZE/BANDAGES/DRESSINGS) ×3 IMPLANT
DRSG TEGADERM 4X4.75 (GAUZE/BANDAGES/DRESSINGS) ×1 IMPLANT
DRSG TELFA 3X4 N-ADH STERILE (GAUZE/BANDAGES/DRESSINGS) ×1 IMPLANT
ELECT CAUTERY BLADE TIP 2.5 (TIP) ×1
ELECT EMG 20MM DUAL (MISCELLANEOUS) ×2
ELECT NEEDLE 20X.3 GREEN (MISCELLANEOUS) ×1
ELECT REM PT RETURN 9FT ADLT (ELECTROSURGICAL) ×1
ELECTRODE CAUTERY BLDE TIP 2.5 (TIP) ×1 IMPLANT
ELECTRODE EMG 20MM DUAL (MISCELLANEOUS) ×2 IMPLANT
ELECTRODE NDL 20X.3 GREEN (MISCELLANEOUS) ×1 IMPLANT
ELECTRODE NEEDLE 20X.3 GREEN (MISCELLANEOUS) ×1 IMPLANT
ELECTRODE REM PT RTRN 9FT ADLT (ELECTROSURGICAL) ×1 IMPLANT
FORCEPS JEWEL BIP 4-3/4 STR (INSTRUMENTS) ×1 IMPLANT
GAUZE 4X4 16PLY ~~LOC~~+RFID DBL (SPONGE) ×2 IMPLANT
GLOVE BIO SURGEON STRL SZ7.5 (GLOVE) ×1 IMPLANT
GLOVE PROTEXIS LATEX SZ 7.5 (GLOVE) ×1 IMPLANT
GLOVE SURG LATEX 7.5 PF (GLOVE) ×1 IMPLANT
GOWN STRL REUS W/ TWL LRG LVL3 (GOWN DISPOSABLE) ×3 IMPLANT
GOWN STRL REUS W/TWL LRG LVL3 (GOWN DISPOSABLE) ×3
HEMOSTAT SURGICEL 2X3 (HEMOSTASIS) IMPLANT
HOOK STAY BLUNT/RETRACTOR 5M (MISCELLANEOUS) ×1 IMPLANT
LABEL OR SOLS (LABEL) ×1 IMPLANT
MANIFOLD NEPTUNE II (INSTRUMENTS) ×1 IMPLANT
PACK HEAD/NECK (MISCELLANEOUS) ×1 IMPLANT
PROBE MONO 100X0.75 ELECT 1.9M (MISCELLANEOUS) ×1 IMPLANT
SHEARS HARMONIC 9CM CVD (BLADE) ×1 IMPLANT
SPONGE KITTNER 5P (MISCELLANEOUS) ×2 IMPLANT
SUT ETHILON 6 0 9-3 1X18 BLK (SUTURE) ×1 IMPLANT
SUT MERSILENE 4-0 WHT RB-1 (SUTURE) ×2 IMPLANT
SUT PROLENE 6 0 PC 1 (SUTURE) ×2 IMPLANT
SUT SILK 2 0 (SUTURE) ×1
SUT SILK 2-0 18XBRD TIE 12 (SUTURE) ×1 IMPLANT
SUT VIC AB 4-0 RB1 18 (SUTURE) ×1 IMPLANT
SYSTEM CHEST DRAIN TLS 7FR (DRAIN) ×1 IMPLANT
TRAP FLUID SMOKE EVACUATOR (MISCELLANEOUS) ×1 IMPLANT

## 2022-02-13 NOTE — Anesthesia Procedure Notes (Signed)
Procedure Name: Intubation Date/Time: 02/13/2022 8:44 AM  Performed by: Beverely Low, CRNAPre-anesthesia Checklist: Patient identified, Patient being monitored, Timeout performed, Emergency Drugs available and Suction available Patient Re-evaluated:Patient Re-evaluated prior to induction Oxygen Delivery Method: Circle system utilized Preoxygenation: Pre-oxygenation with 100% oxygen Induction Type: IV induction Ventilation: Mask ventilation without difficulty Laryngoscope Size: 3 and McGraph Grade View: Grade I Tube type: Oral Nasal Tubes: Nasal prep performed Tube size: 7.0 mm Number of attempts: 1 Airway Equipment and Method: Stylet Placement Confirmation: ETT inserted through vocal cords under direct vision, positive ETCO2 and breath sounds checked- equal and bilateral Secured at: 21 cm Tube secured with: Tape Dental Injury: Teeth and Oropharynx as per pre-operative assessment

## 2022-02-13 NOTE — Progress Notes (Signed)
02/13/2022 5:53 PM  Nancy Nunez [id number]  Post-Op Check: She had left superficial parotidectomy with advancement flap closure this morning.    Temp:  [96.8 F (36 C)-97.9 F (36.6 C)] 97.9 F (36.6 C) (09/06 1246) Pulse Rate:  [96-107] 105 (09/06 1246) Resp:  [7-19] 15 (09/06 1230) BP: (120-149)/(50-91) 137/80 (09/06 1246) SpO2:  [92 %-100 %] 98 % (09/06 1246) Weight:  [81.6 kg-84.8 kg] 84.8 kg (09/06 1244),     Intake/Output Summary (Last 24 hours) at 02/13/2022 1753 Last data filed at 02/13/2022 1215 Gross per 24 hour  Intake 900 ml  Output 6 ml  Net 894 ml    Results for orders placed or performed during the hospital encounter of 02/13/22 (from the past 24 hour(s))  Glucose, capillary     Status: Abnormal   Collection Time: 02/13/22  7:17 AM  Result Value Ref Range   Glucose-Capillary 156 (H) 70 - 99 mg/dL  Pregnancy, urine POC     Status: None   Collection Time: 02/13/22  7:23 AM  Result Value Ref Range   Preg Test, Ur NEGATIVE NEGATIVE  Glucose, capillary     Status: Abnormal   Collection Time: 02/13/22 11:21 AM  Result Value Ref Range   Glucose-Capillary 202 (H) 70 - 99 mg/dL    SUBJECTIVE: Patient has been feeling nauseated.  She was given some Zofran and that did not solve the problem.  She now has been given some Compazine and seems like her stomach is feeling better.  She denies having any significant pain.  She has been using ice on the wound and try not to use oral pain medications  OBJECTIVE: Her vital signs are stable.  The wound is flat with no significant swelling or bruising.  The drain seems to be stable with very little output.  She has good facial mobility with no weakness noted anywhere.  IMPRESSION: She is about 7 hours postop and so far is doing excellently.  PLAN: We will plan to slowly increase diet as her stomach feels better.  She can use pain medication as needed.  I will see her in the morning and remove the drain and redress the wound.   We will plan to discharge her home in the morning if she is doing well.  We will have the Vacutainer changed for her drain every 4 hours to make sure there is still good suction.  Nancy Nunez 02/13/2022, 5:53 PM

## 2022-02-13 NOTE — Anesthesia Preprocedure Evaluation (Signed)
Anesthesia Evaluation  Patient identified by MRN, date of birth, ID band Patient awake    Reviewed: Allergy & Precautions, H&P , NPO status , Patient's Chart, lab work & pertinent test results, reviewed documented beta blocker date and time   Airway Mallampati: II  TM Distance: >3 FB Neck ROM: full    Dental  (+) Teeth Intact   Pulmonary neg pulmonary ROS,    Pulmonary exam normal        Cardiovascular Exercise Tolerance: Good negative cardio ROS Normal cardiovascular exam Rhythm:regular Rate:Normal     Neuro/Psych Anxiety negative neurological ROS  negative psych ROS   GI/Hepatic Neg liver ROS, GERD  Medicated,  Endo/Other  negative endocrine ROSdiabetes, Well Controlled, Type 1, Insulin Dependent  Renal/GU negative Renal ROS  negative genitourinary   Musculoskeletal   Abdominal   Peds  Hematology  (+) Blood dyscrasia, anemia ,   Anesthesia Other Findings Past Medical History: No date: Anemia No date: Anxiety 12/2020: COVID-19 No date: Diabetes mellitus without complication (HCC) No date: Family history of adverse reaction to anesthesia     Comment:  unknown reaction (mother) when waking up after kidney               transplant No date: GERD (gastroesophageal reflux disease) Past Surgical History: No date: CHOLECYSTECTOMY No date: ESOPHAGOGASTRODUODENOSCOPY     Comment:  x3 No date: GYNECOLOGIC CRYOSURGERY No date: SUPERFICIAL LYMPH NODE BIOPSY / EXCISION BMI    Body Mass Index: 30.40 kg/m     Reproductive/Obstetrics negative OB ROS                             Anesthesia Physical Anesthesia Plan  ASA: 2  Anesthesia Plan: General ETT   Post-op Pain Management:    Induction:   PONV Risk Score and Plan: 4 or greater  Airway Management Planned:   Additional Equipment:   Intra-op Plan:   Post-operative Plan:   Informed Consent: I have reviewed the patients History  and Physical, chart, labs and discussed the procedure including the risks, benefits and alternatives for the proposed anesthesia with the patient or authorized representative who has indicated his/her understanding and acceptance.     Dental Advisory Given  Plan Discussed with: CRNA  Anesthesia Plan Comments:         Anesthesia Quick Evaluation

## 2022-02-13 NOTE — Op Note (Signed)
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  She was given general anesthesia by oral endotracheal the patient.  Once the anesthesia was complete the left side was marked where the mass was as well as marking an incision line.  10 mL of 1% Xylocaine with epi 1:100,000 was used for infiltration in the anterior placed neck superficially.  Electrodes were then placed in the orbicularis oris muscle and also in the orbicularis oculi muscle.  Grounds were placed into the middle of the cheek muscles.  These were connected for continuous monitoring of the facial nerve.  She is then prepped and draped in sterile fashion.  Shoulder roll was placed the head extended to the right side.  Incision was then made in the skin starting preauricular and extending around the earlobe into a lower skin crease.  The skin was elevated in the subcu plane.  This was done anteriorly and posteriorly.  Dural hooks were used to hold the skin apart.  The parotid tissue was then freed up from the fascia of the sternocleidomastoid muscle and separated from the anterior tragal cartilage.  Once it was freed up from the anterior border of the SCM dissection was then carried deeper along the tympanomastoid suture line to find the facial nerve.  Nerve stimulators were used to locate this and then to expose it more.  Tissues above it were freed up using the harmonic scalpel.  Once the facial nerve was found it was tracked away from the bone to expose the full trunk of the  nerve.  He could see 2 branches going superiorly and one branch heading inferiorly.  The superior branches were not dissected out as the tumor was inferior in the gland.  Dissection was carried along the inferior branches and separating the lower portion of the superficial gland from the upper portion  The retro facial vein coursed just inferior to the lower branch of the facial nerve and had to be cross clamped and tied off.  There were some anterior branches off of the inferior facial nerve branch and these were tracked forward and went underneath the portion of the gland that was removed.  Some of the parotid fascia was removed along its inferior border a very firm mass was noted just under the angle of the jaw border and this was freed up with the superficial portion of the parotid and was all removed together.  The inferior branch of the facial nerve was intact and stimulated and you could see good movement of the neck and lower lip.  There is no significant bleeding noted anywhere.  The wound was irrigated.  Some Surgicel was placed in the wound and then a 7 [place] TLS drain was placed through a separate inferior stab wound incision and left sitting in the depth of the wound.  4-0 Mersilene was then used to pull and advance the parotid fascia posterior, and sutured to the muscle and fascia of the sternocleidomastoid.  This  was used to help close the defect.  Multiple horizontal mattress sutures were placed.  The skin was then draped over this and the skin edges were lying without any tension on the wound.  The dermis was then closed with 4-0 Vicryl sutures interrupted.  The skin edges were then held in apposition with a 6-0 running Prolene.  The wound was covered with some bacitracin followed by Telfa and Tegaderm.  The drain was placed to low continuous Vacutainer suction.  The patient was awakened and taken to the recovery room in satisfactory condition.  There were no operative complications.   Dispo:  To PACU to then be put in observation overnight to evaluate the wound and remove the drain in the morning  Plan:  Ice, elevation, analgesia, 23 hour suction drain  Huey Romans 02/13/2022 11:19 AM

## 2022-02-13 NOTE — TOC Initial Note (Signed)
Transition of Care [facility]) - Initial/Assessment Note    Patient Details  Name: Nancy Nunez MRN: [id number] Date of Birth: Feb 08, 1981  Transition of Care [facility]) CM/SW Contact:    Beverly Sessions, RN Phone Number: 02/13/2022, 4:25 PM  Clinical Narrative:                   Transition of Care [facility]) Screening Note   Patient Details  Name: Nancy Nunez Date of Birth: 17-Jan-1981   Transition of Care [facility]) CM/SW Contact:    Beverly Sessions, RN Phone Number: 02/13/2022, 4:26 PM    Transition of Care Department [facility]) has reviewed patient and no TOC needs have been identified at this time. We will continue to monitor patient advancement through interdisciplinary progression rounds. If new patient transition needs arise, please place a TOC consult.         Patient Goals and CMS Choice        Expected Discharge Plan and Services                                                Prior Living Arrangements/Services                       Activities of Daily Living Home Assistive Devices/Equipment: CBG Meter ADL Screening (condition at time of admission) Patient's cognitive ability adequate to safely complete daily activities?: Yes Is the patient deaf or have difficulty hearing?: No Does the patient have difficulty seeing, even when wearing glasses/contacts?: No Does the patient have difficulty concentrating, remembering, or making decisions?: No Patient able to express need for assistance with ADLs?: Yes Does the patient have difficulty dressing or bathing?: No Independently performs ADLs?: Yes (appropriate for developmental age) Does the patient have difficulty walking or climbing stairs?: No Weakness of Legs: None Weakness of Arms/Hands: None  Permission Sought/Granted                  Emotional Assessment              Admission diagnosis:  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

## 2022-02-13 NOTE — Transfer of Care (Signed)
Immediate Anesthesia Transfer of Care Note  Patient: Nancy Nunez  Procedure(s) Performed: PAROTIDECTOMY (Left)  Patient Location: PACU  Anesthesia Type:General  Level of Consciousness: drowsy  Airway & Oxygen Therapy: Patient Spontanous Breathing and Patient connected to face mask oxygen  Post-op Assessment: Report given to RN and Post -op Vital signs reviewed and stable  Post vital signs: Reviewed and stable  Last Vitals:  Vitals Value Taken Time  BP    Temp    Pulse    Resp    SpO2      Last Pain:  Vitals:   02/13/22 0716  TempSrc: Temporal  PainSc: 0-No pain      Patients Stated Pain Goal: 0 (10/40/45 9136)  Complications: No notable events documented.

## 2022-02-13 NOTE — Progress Notes (Signed)
Verbal order placed for compazine per Dr. Kathyrn Sheriff. Pt made aware and medicated. VWilliams,RN.

## 2022-02-13 NOTE — H&P (Signed)
H&P has been reviewed and patient reevaluated, no changes necessary. To be downloaded later.  

## 2022-02-14 DIAGNOSIS — D351 Benign neoplasm of parathyroid gland: Secondary | ICD-10-CM | POA: Diagnosis not present

## 2022-02-14 LAB — SURGICAL PATHOLOGY

## 2022-02-14 LAB — HIV ANTIBODY (ROUTINE TESTING W REFLEX): HIV Screen 4th Generation wRfx: NONREACTIVE

## 2022-02-14 MED ORDER — HYDROCODONE-ACETAMINOPHEN 5-325 MG PO TABS: 1.0000 | ORAL_TABLET | Freq: Four times a day (QID) | ORAL | 0 refills | Status: AC | PRN

## 2022-02-14 NOTE — Progress Notes (Signed)
Pt discharged to home. DC instructions given. No concerns voiced. Pt left unit in wheelchair pushed by [place], NT. Left in stable condition.

## 2022-02-14 NOTE — Discharge Summary (Signed)
Nancy Nunez, Nancy Nunez [id number] [birth year] Margaretha Sheffield, MD  Discharge summary  HPI: The patient was admitted to the hospital yesterday morning for removal of a left parotid tumor.  She had left superficial parotidectomy with removal of tumor and advancement flap closure.  This morning she is feeling well and is no longer nauseated.  She has minor pain that is controlled with pain medication.  Her drain is removed and the bandage has been changed.  There is no swelling or signs of bleeding and the wound looks excellent.  Allergies:  Allergies  Allergen Reactions   Bee Venom Anaphylaxis   Peanut-Containing Drug Products Anaphylaxis and Swelling    Pt states she felt like something was in her throat after eating peanuts.    Pineapple Anaphylaxis and Hives   Latex Hives, Itching and Swelling    ROS: Review of systems normal other than 12 systems except per HPI.  PMH:  Past Medical History:  Diagnosis Date   Anemia    Anxiety    COVID-19 12/2020   Diabetes mellitus without complication (HCC)    Family history of adverse reaction to anesthesia    unknown reaction (mother) when waking up after kidney transplant   GERD (gastroesophageal reflux disease)     FH: History reviewed. No pertinent family history.  SH:  Social History   Socioeconomic History   Marital status: Single    Spouse name: Not on file   Number of children: Not on file   Years of education: Not on file   Highest education level: Not on file  Occupational History   Not on file  Tobacco Use   Smoking status: Never   Smokeless tobacco: Not on file  Vaping Use   Vaping Use: Never used  Substance and Sexual Activity   Alcohol use: No   Drug use: Never   Sexual activity: Not on file  Other Topics Concern   Not on file  Social History Narrative   Not on file   Social Determinants of Health   Financial Resource Strain: Not on file  Food Insecurity: Not on file  Transportation Needs: Not on file   Physical Activity: Not on file  Stress: Not on file  Social Connections: Not on file  Intimate Partner Violence: Not on file    PSH:  Past Surgical History:  Procedure Laterality Date   CHOLECYSTECTOMY     ESOPHAGOGASTRODUODENOSCOPY     x3   GYNECOLOGIC CRYOSURGERY     PAROTIDECTOMY Left 02/13/2022   Procedure: PAROTIDECTOMY;  Surgeon: Margaretha Sheffield, MD;  Location: [facility] ORS;  Service: ENT;  Laterality: Left;   SUPERFICIAL LYMPH NODE BIOPSY / EXCISION      Physical  Exam: The patient is awake and alert and feeling mild discomfort in the left neck.  Her facial nerves are intact and she has good symmetrical movement of her entire face.  Her wound has no swelling and is not inflamed.  The drain is removed and a fresh dressing applied.   A/P: The patient is 1 day postop left superficial parotidectomy with removal of parotid tumor and advancement flap closure.  The wound is healing well.  She is discharged home this morning to rest at home.  She will slowly increase her diet as tolerated.  She will restart all her medications similar to prior to surgery.  We will plan to see her in the office in 6 days for suture removal.  She will call sooner if there are problems   Nancy Nunez  Nancy Nunez 02/14/2022 6:32 AM

## 2022-02-18 NOTE — Anesthesia Postprocedure Evaluation (Signed)
Anesthesia Post Note  Patient: Nancy Nunez  Procedure(s) Performed: PAROTIDECTOMY (Left: Face)  Patient location during evaluation: PACU Anesthesia Type: General Level of consciousness: awake and alert Pain management: pain level controlled Vital Signs Assessment: post-procedure vital signs reviewed and stable Respiratory status: spontaneous breathing, nonlabored ventilation, respiratory function stable and patient connected to nasal cannula oxygen Cardiovascular status: blood pressure returned to baseline and stable Postop Assessment: no apparent nausea or vomiting Anesthetic complications: no   No notable events documented.   Last Vitals:  Vitals:   02/13/22 2337 02/14/22 0401  BP: (!) 112/59 112/68  Pulse: (!) 114 (!) 108  Resp: 20 20  Temp: 36.9 C 36.8 C  SpO2: 98% 98%    Last Pain:  Vitals:   02/14/22 0509  TempSrc:   PainSc: [name]

## 2022-04-19 ENCOUNTER — Encounter: Payer: Self-pay | Admitting: Internal Medicine

## 2022-04-19 ENCOUNTER — Ambulatory Visit (INDEPENDENT_AMBULATORY_CARE_PROVIDER_SITE_OTHER): Payer: Commercial Managed Care - PPO | Admitting: Internal Medicine

## 2022-04-19 VITALS — BP 138/88 | HR 111 | Temp 97.7°F | Resp 16 | Ht 64.0 in | Wt 178.0 lb

## 2022-04-19 DIAGNOSIS — R300 Dysuria: Secondary | ICD-10-CM

## 2022-04-19 DIAGNOSIS — E1165 Type 2 diabetes mellitus with hyperglycemia: Secondary | ICD-10-CM

## 2022-04-19 DIAGNOSIS — D509 Iron deficiency anemia, unspecified: Secondary | ICD-10-CM

## 2022-04-19 HISTORY — DX: Iron deficiency anemia, unspecified: D50.9

## 2022-04-19 HISTORY — DX: Type 2 diabetes mellitus with hyperglycemia: E11.65

## 2022-04-19 HISTORY — DX: Dysuria: R30.0

## 2022-04-19 LAB — POCT URINALYSIS DIPSTICK
Bilirubin, UA: NEGATIVE
Blood, UA: NEGATIVE
Glucose, UA: NEGATIVE
Ketones, UA: NEGATIVE
Leukocytes, UA: NEGATIVE
Nitrite, UA: NEGATIVE
Odor: NEGATIVE
Protein, UA: NEGATIVE
Spec Grav, UA: >=1.03 — AB (ref 1.010–1.025)
Urobilinogen, UA: 0.2 E.U./dL
pH, UA: 6 (ref 5.0–8.0)

## 2022-04-19 NOTE — Addendum Note (Signed)
Addended byRico Sheehan on: 04/19/2022 04:09 PM   Modules accepted: Orders

## 2022-04-19 NOTE — Assessment & Plan Note (Signed)
She has appointment with the eye doctor in 2 weeks for an annual exam.  Her diabetes has not been controlled and she has had some issues with getting her ozempic '2mg'$ .  We will check a HgBA1c today.

## 2022-04-19 NOTE — Assessment & Plan Note (Signed)
She is now on iron.  She has had a discussion with GYN and they have discussed hystrectomy in the past.  We will check her iron studies at this time.  She may need ferreheme.

## 2022-04-19 NOTE — Assessment & Plan Note (Signed)
Her UA today is completely normal.  I think her symptoms are related to her diabetes.

## 2022-04-19 NOTE — Progress Notes (Signed)
Office Visit  Subjective   Patient ID: Nancy Nunez   DOB: September 10, 1980   Age: 41 y.o.   MRN: [id number]   Chief Complaint Chief Complaint  Patient presents with   Acute Visit    Possible UTI     History of Present Illness  The patient is a 41 year old African American/Black female who returns for a follow-up visit for her T2 diabetes.  On her last visit, her diabetes was not control where I increased her novolog to 7Units from one meal a day to 7 Units to 3 meals per day.  Earlier this year, I increased her ozempic from '1mg'$  to '2mg'$  subcut daily and we restarted her on metformin ER.  She has had diarrhea from metformin in the past but her diarrhea is tolerable.  She denies any side effects from ozempic including no nausea or vomiting.  Again, her insurance quit covering [place].  She was diagnosed with T2 diabetes in 2014 when she was 41 years old. She used to see endocrinology but has not seen them for years.  She remains on Lantus 36 Units daily,  ozempic '2mg'$  subcut weekly and metformin ER '500mg'$  once a day, and novolog 7 Units daily with each meal).  She is not walking as much as they would like. She specifically denies unexplained abdominal pain, nausea or vomiting and documented hypoglycemia. She checks blood sugars twice a day and they tend to range somewhere between 150 and 190 mg/dl. She came in fasting today in anticipation of lab work. Her last HgbA1c was done 3 months ago and was 8.7%.   I also started her on kerendia this past year.  I also started her on low dose Ramipril for her diabetes and also stared her on a statin this past year.  There is no long term complications of diabetic retinopathy, neuropathy, nephropathy or cardiovascular disease.   Also on her last visit, we did start her on iron as she has iron deficiency anemia due to blood loss from uterine fibroids.  Her period will last 7-12 days and is heavy.    Today, she also had some some urgency and dysuria.  She denies  any hematuria, n/v, abdominal pain, f/c or other problems.        Past Medical History Past Medical History:  Diagnosis Date   Anemia    Anxiety    COVID-19 12/2020   Diabetes mellitus without complication (HCC)    Family history of adverse reaction to anesthesia    unknown reaction (mother) when waking up after kidney transplant   GERD (gastroesophageal reflux disease)      Allergies Allergies  Allergen Reactions   Bee Venom Anaphylaxis   Peanut-Containing Drug Products Anaphylaxis and Swelling    Pt states she felt like something was in her throat after eating peanuts.    Pineapple Anaphylaxis and Hives   Latex Hives, Itching and Swelling     Review of Systems Review of Systems  Constitutional:  Negative for chills, fever, malaise/fatigue and weight loss.  Eyes:  Positive for blurred vision. Negative for double vision.  Respiratory:  Negative for cough, shortness of breath and wheezing.   Cardiovascular:  Negative for chest pain and leg swelling.  Gastrointestinal:  Negative for abdominal pain, nausea and vomiting.  Genitourinary:  Positive for dysuria and frequency. Negative for flank pain, hematuria and urgency.  Musculoskeletal:  Negative for myalgias.  Skin:  Negative for rash.  Neurological:  Negative for dizziness, weakness and headaches.  Objective:    Vitals BP 138/88 (BP Location: Left Arm, Patient Position: Sitting, Cuff Size: Normal)   Pulse (!) 111   Temp 97.7 F (36.5 C) (Temporal)   Resp 16   Ht '5\' 4"'$  (1.626 m)   Wt 178 lb 0.4 oz (80.8 kg)   SpO2 99%   BMI 30.56 kg/m    Physical Examination Physical Exam Constitutional:      General: She is not in acute distress.    Appearance: Normal appearance. She is not ill-appearing.  Cardiovascular:     Rate and Rhythm: Normal rate and regular rhythm.     Pulses: Normal pulses.     Heart sounds: Normal heart sounds. No murmur heard.    No friction rub. No gallop.  Pulmonary:     Effort:  Pulmonary effort is normal. No respiratory distress.     Breath sounds: Normal breath sounds. No wheezing, rhonchi or rales.  Abdominal:     General: Abdomen is flat. Bowel sounds are normal. There is no distension.     Palpations: Abdomen is soft.     Tenderness: There is no abdominal tenderness.  Musculoskeletal:     Right lower leg: No edema.     Left lower leg: No edema.  Skin:    General: Skin is warm and dry.     Findings: No rash.  Neurological:     Mental Status: She is alert.        Assessment & Plan:   Inadequately controlled diabetes mellitus ([place]) She has appointment with the eye doctor in 2 weeks for an annual exam.  Her diabetes has not been controlled and she has had some issues with getting her ozempic '2mg'$ .  We will check a HgBA1c today.  Iron deficiency anemia She is now on iron.  She has had a discussion with GYN and they have discussed hystrectomy in the past.  We will check her iron studies at this time.  She may need ferreheme.  Dysuria Her UA today is completely normal.  I think her symptoms are related to her diabetes.    Return in about 3 months (around 07/20/2022) for annual.    Townsend Roger, MD

## 2022-04-19 NOTE — Progress Notes (Signed)
Order(s) created erroneously. Erroneous order ID: [id number]  Order canceled by: Willy Eddy  Order cancel date/time: 04/19/2022 2:11 PM

## 2022-04-20 LAB — URINALYSIS, COMPLETE

## 2022-04-20 LAB — CBC WITH DIFFERENTIAL/PLATELET
Basophils Absolute: 0 10*3/uL (ref 0.0–0.2)
Basos: 0 %
EOS (ABSOLUTE): 0 10*3/uL (ref 0.0–0.4)
Eos: 1 %
Hematocrit: 33.1 % — ABNORMAL LOW (ref 34.0–46.6)
Hemoglobin: 10.2 g/dL — ABNORMAL LOW (ref 11.1–15.9)
Immature Grans (Abs): 0 10*3/uL (ref 0.0–0.1)
Immature Granulocytes: 0 %
Lymphocytes Absolute: 3.2 10*3/uL — ABNORMAL HIGH (ref 0.7–3.1)
Lymphs: 46 %
MCH: 24.6 pg — ABNORMAL LOW (ref 26.6–33.0)
MCHC: 30.8 g/dL — ABNORMAL LOW (ref 31.5–35.7)
MCV: 80 fL (ref 79–97)
Monocytes Absolute: 0.4 10*3/uL (ref 0.1–0.9)
Monocytes: 6 %
Neutrophils Absolute: 3.2 10*3/uL (ref 1.4–7.0)
Neutrophils: 47 %
Platelets: 441 10*3/uL (ref 150–450)
RBC: 4.14 x10E6/uL (ref 3.77–5.28)
RDW: 13.8 % (ref 11.7–15.4)
WBC: 6.9 10*3/uL (ref 3.4–10.8)

## 2022-04-20 LAB — IRON AND TIBC
Iron Saturation: 5 % — CL (ref 15–55)
Iron: 21 ug/dL — ABNORMAL LOW (ref 27–159)
Total Iron Binding Capacity: 432 ug/dL (ref 250–450)
UIBC: 411 ug/dL (ref 131–425)

## 2022-04-20 LAB — FERRITIN: Ferritin: 6 ng/mL — ABNORMAL LOW (ref 15–150)

## 2022-04-20 LAB — HEMOGLOBIN A1C
Est. average glucose Bld gHb Est-mCnc: 206 mg/dL
Hgb A1c MFr Bld: 8.8 % — ABNORMAL HIGH (ref 4.8–5.6)

## 2022-04-26 ENCOUNTER — Ambulatory Visit: Payer: Commercial Managed Care - PPO | Admitting: Internal Medicine

## 2022-05-01 ENCOUNTER — Telehealth: Payer: Self-pay

## 2022-05-01 NOTE — Telephone Encounter (Signed)
-----   Message from Townsend Roger, MD sent at 04/24/2022  8:48 AM EST ----- Her diabetes is not controlled.  Cut her ozempic back to '1mg'$  subcut weekly since she cannot find the '2mg'$  ozempic and increase her lantus to 42 units daily.  Her iron levels are low.  We need to set her up for ferreheme infusion x 1, then repeat again in like 1 week.

## 2022-05-07 MED FILL — Ondansetron HCl Inj 4 MG/2ML (2 MG/ML): INTRAMUSCULAR | Qty: 2 | Status: CN

## 2022-05-07 MED FILL — Ondansetron HCl Inj 4 MG/2ML (2 MG/ML): INTRAMUSCULAR | Qty: 2 | Status: AC

## 2022-05-15 ENCOUNTER — Other Ambulatory Visit: Payer: Self-pay

## 2022-05-15 MED ORDER — FERROUS SULFATE 300 (60 FE) MG/5ML PO SOLN: 300.0000 mg | Freq: Every day | ORAL | 3 refills | Status: DC

## 2022-05-16 ENCOUNTER — Telehealth: Payer: Self-pay

## 2022-05-16 NOTE — Telephone Encounter (Signed)
-----   Message from Townsend Roger, MD sent at 04/24/2022  8:48 AM EST ----- Her diabetes is not controlled.  Cut her ozempic back to '1mg'$  subcut weekly since she cannot find the '2mg'$  ozempic and increase her lantus to 42 units daily.  Her iron levels are low.  We need to set her up for ferreheme infusion x 1, then repeat again in like 1 week.

## 2022-07-24 ENCOUNTER — Other Ambulatory Visit: Payer: Self-pay | Admitting: Internal Medicine

## 2022-10-17 IMAGING — CT CT NECK W/ CM
4 of 5 series · 14 of 33 positions shown, 16 images · IV contrast (agent unspecified)
Comparison: None Available.

CLINICAL DATA: Neoplasm of uncertain behavior of parotid gland.
Left facial knot for 1 year.

EXAM:
CT NECK WITH CONTRAST
TECHNIQUE: Multidetector CT imaging of the neck was performed using the
standard protocol following the bolus administration of intravenous
contrast.

[Series 3: axial neck neck (person_name) 2.00 · axial · 0.57mm/px · z∈[-623,-513]mm · 3 of 111 slices shown]
[im 28/111  bone]
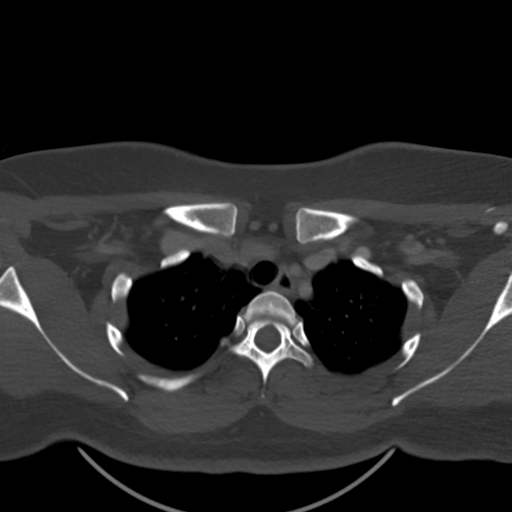
[im 56/111  bone]
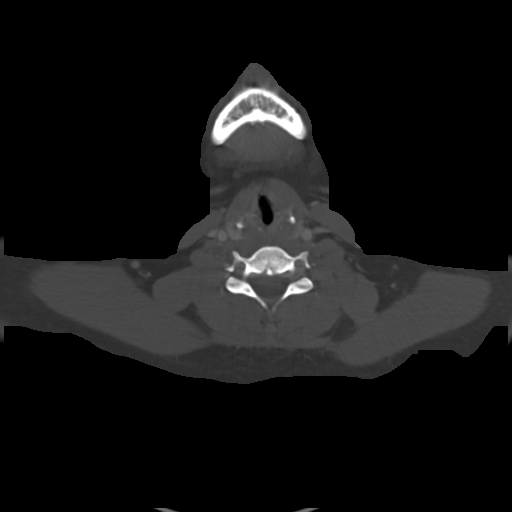
[im 83/111  bone]
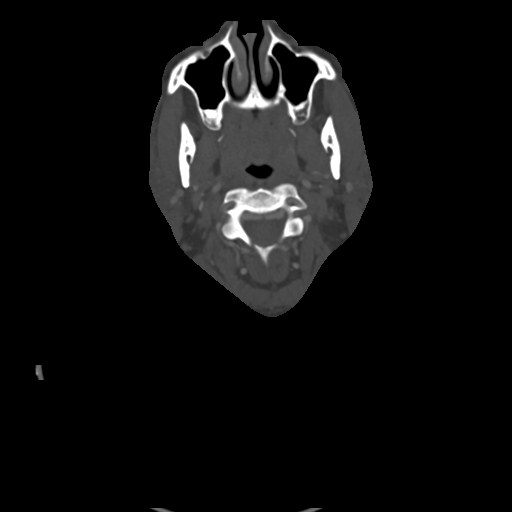

[Series 5: coronal neck neck (person_name) 2.00 cor · coronal · 0.56mm/px · 3 of 122 slices shown]
[im 31/122  bone]
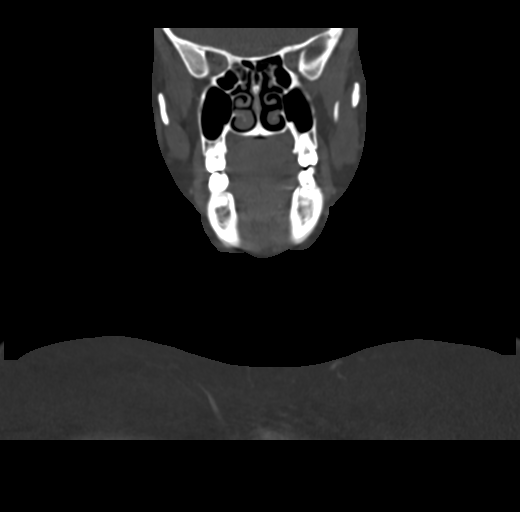
[im 51/122  bone]
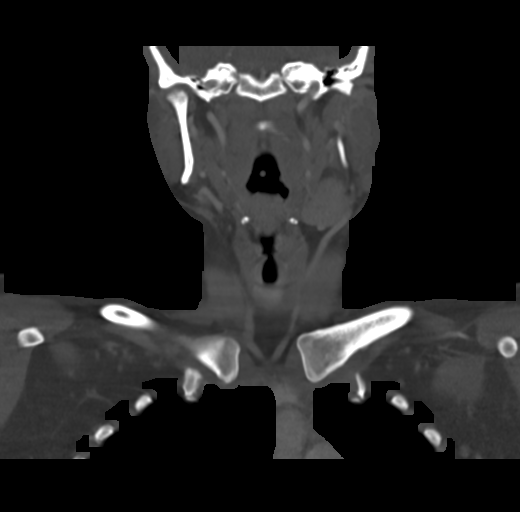
[im 71/122  bone]
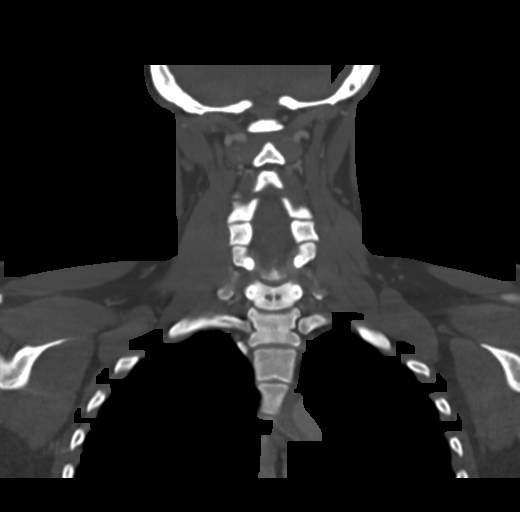

[Series 7: sagittal neck neck (person_name) 2.00 sag · sagittal · 0.48mm/px · 5 of 146 slices shown, 6 images]
[im 49/146  bone]
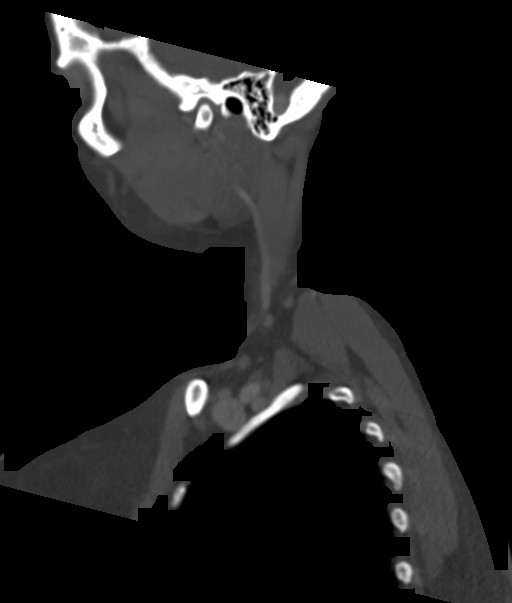
[im 61/146  bone]
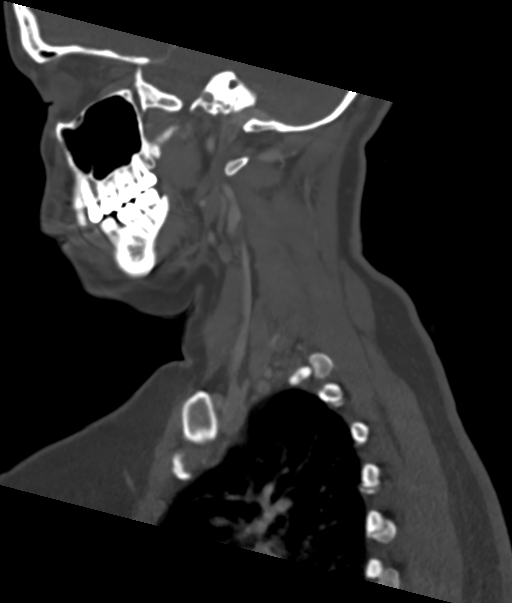
[im 73/146  soft-tissue]
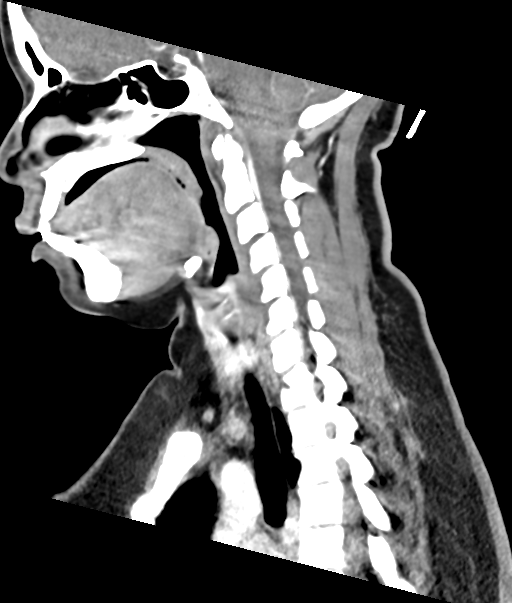
[im 73/146  bone]
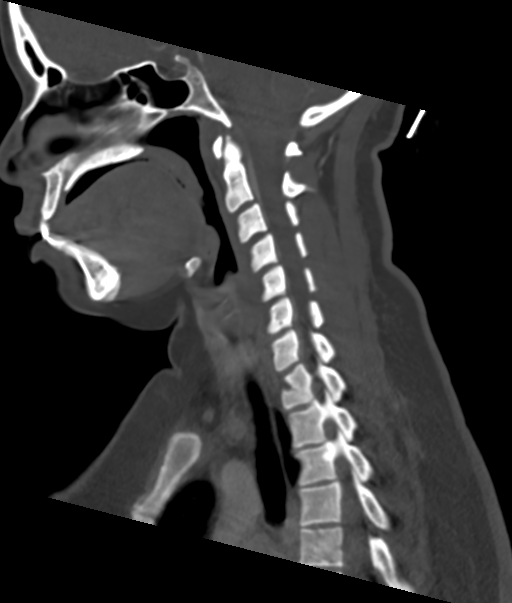
[im 85/146  bone]
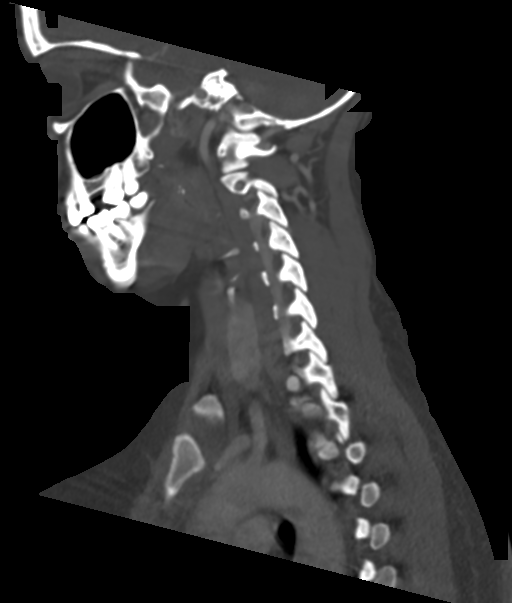
[im 97/146  bone]
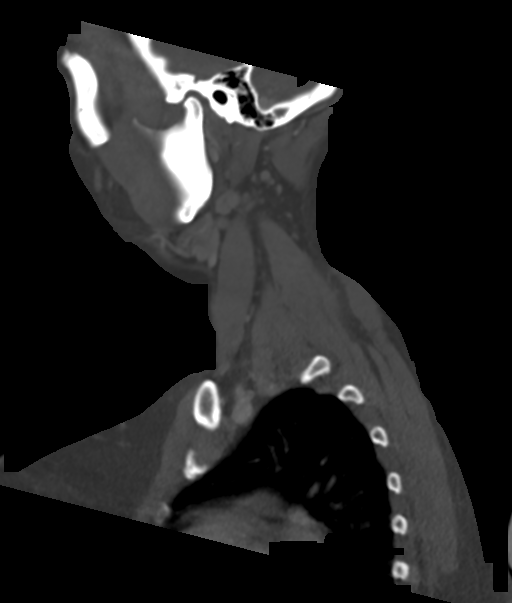

[Series 9: ax oropharynx neck neck (person_name) 2.00 ax · axial · 0.48mm/px · z∈[-664,-526]mm · 3 of 143 slices shown, 4 images]
[im 36/143  soft-tissue]
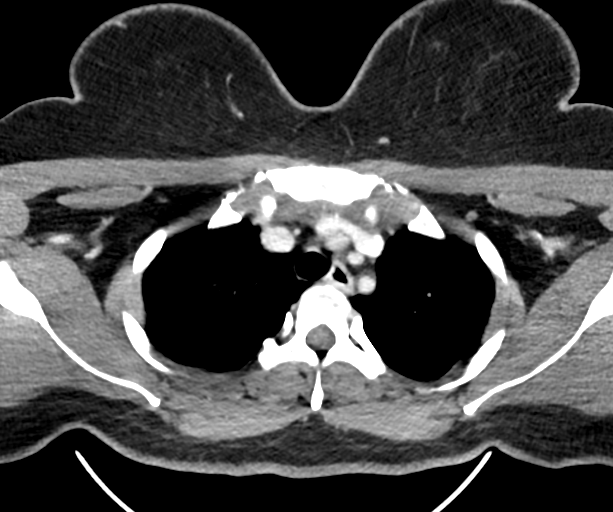
[im 36/143  bone]
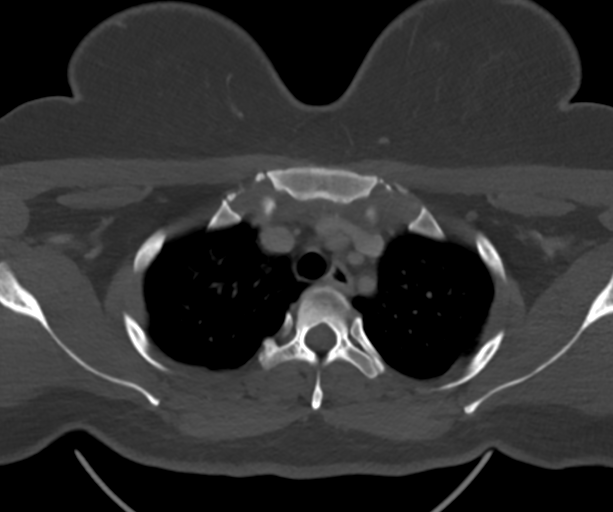
[im 72/143  bone]
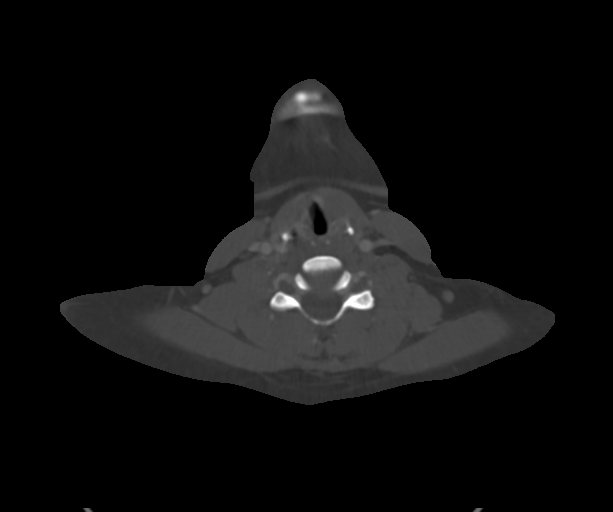
[im 107/143  bone]
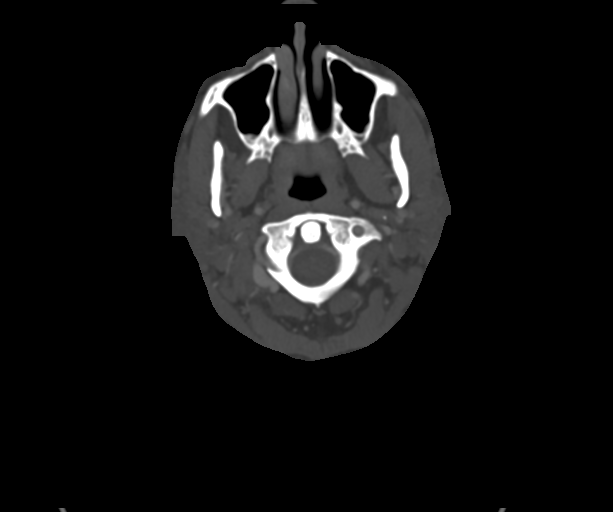

[14 of 33 positions shown; findings below may reference images not displayed]

RADIATION DOSE REDUCTION: This exam was performed according to the
departmental dose-optimization program which includes automated
exposure control, adjustment of the mA and/or kV according to
patient size and/or use of iterative reconstruction technique.

CONTRAST:  75mL OMNIPAQUE IOHEXOL 300 MG/ML  SOLN
FINDINGS: Pharynx and larynx: No evidence of mass or swelling within
limitations of motion artifact through the larynx. Widely patent
airway. No fluid collection or inflammatory changes in the
parapharyngeal or retropharyngeal spaces.

Salivary glands: Nonvisualization of the right submandibular gland,
presumably surgically absent with scarring seen in this region.
Unremarkable appearance of the left submandibular and right parotid
glands. 1.3 cm heterogeneously, predominantly peripherally enhancing
mass in the left parotid tail corresponding to the palpable
abnormality. Punctate calcification more superiorly in the left
parotid gland. No evidence of acute inflammation.

Thyroid: Unremarkable.

Lymph nodes: No enlarged or suspicious lymph nodes in the neck.

Vascular: Major vascular structures of the neck are grossly patent.

Limited intracranial: Unremarkable.

Visualized orbits: Unremarkable.

Mastoids and visualized paranasal sinuses: Clear paranasal sinuses.
Small left mastoid effusion.

Skeleton: No acute osseous abnormality or suspicious osseous lesion.

Upper chest: Clear lung apices.

Other: None.
IMPRESSION: 1. 1.3 cm mass in the left parotid tail, nonspecific but favored to
represent a primary salivary neoplasm.
2. No evidence of cervical lymphadenopathy.

## 2022-11-03 ENCOUNTER — Other Ambulatory Visit: Payer: Self-pay | Admitting: Internal Medicine

## 2022-11-05 ENCOUNTER — Other Ambulatory Visit: Payer: Self-pay

## 2022-11-05 MED ORDER — OZEMPIC (2 MG/DOSE) 8 MG/3ML ~~LOC~~ SOPN: 2.0000 mg | PEN_INJECTOR | SUBCUTANEOUS | 0 refills | Status: DC

## 2022-11-06 NOTE — Telephone Encounter (Signed)
Sent in yesterday.

## 2022-11-19 ENCOUNTER — Other Ambulatory Visit: Payer: Self-pay | Admitting: Internal Medicine

## 2022-12-04 ENCOUNTER — Other Ambulatory Visit: Payer: Self-pay | Admitting: Internal Medicine

## 2022-12-23 ENCOUNTER — Encounter: Payer: Self-pay | Admitting: Internal Medicine

## 2022-12-23 ENCOUNTER — Ambulatory Visit: Payer: Federal, State, Local not specified - PPO | Admitting: Internal Medicine

## 2022-12-23 ENCOUNTER — Other Ambulatory Visit: Payer: Self-pay | Admitting: Internal Medicine

## 2022-12-23 VITALS — BP 118/74 | HR 118 | Temp 97.9°F | Resp 18 | Ht 64.0 in | Wt 178.8 lb

## 2022-12-23 DIAGNOSIS — T7840XA Allergy, unspecified, initial encounter: Secondary | ICD-10-CM

## 2022-12-23 DIAGNOSIS — D508 Other iron deficiency anemias: Secondary | ICD-10-CM

## 2022-12-23 DIAGNOSIS — R000 Tachycardia, unspecified: Secondary | ICD-10-CM

## 2022-12-23 DIAGNOSIS — E1165 Type 2 diabetes mellitus with hyperglycemia: Secondary | ICD-10-CM

## 2022-12-23 HISTORY — DX: Allergy, unspecified, initial encounter: T78.40XA

## 2022-12-23 HISTORY — DX: Tachycardia, unspecified: R00.0

## 2022-12-23 MED ORDER — ALBUTEROL SULFATE HFA 108 (90 BASE) MCG/ACT IN AERS: 1.0000 | INHALATION_SPRAY | Freq: Four times a day (QID) | RESPIRATORY_TRACT | 2 refills | Status: DC | PRN

## 2022-12-23 MED ORDER — ALPRAZOLAM 0.25 MG PO TABS: 0.2500 mg | ORAL_TABLET | Freq: Every evening | ORAL | 2 refills | Status: DC | PRN

## 2022-12-23 MED ORDER — FLUTICASONE PROPIONATE 50 MCG/ACT NA SUSP
1.0000 | Freq: Two times a day (BID) | NASAL | 2 refills | Status: DC
Start: 2022-12-23 — End: 2023-03-31

## 2022-12-23 MED ORDER — LOVASTATIN 10 MG PO TABS: ORAL_TABLET | ORAL | 3 refills | Status: DC

## 2022-12-23 NOTE — Assessment & Plan Note (Signed)
We will check her HGBa1c and adjust her meds as necessary.

## 2022-12-23 NOTE — Assessment & Plan Note (Signed)
We will recheck her labs again today including iron studies.

## 2022-12-23 NOTE — Assessment & Plan Note (Signed)
We will refer her to allergy for evaluation.

## 2022-12-23 NOTE — Progress Notes (Signed)
Office Visit  Subjective   Patient ID: Nancy Nunez   DOB: 03/06/1981   Age: 42 y.o.   MRN: [id number]   Chief Complaint Chief Complaint  Patient presents with   Follow-up     History of Present Illness The patient is a 42 year old African American/Black female who returns for a follow-up visit for her T2 diabetes.  Since her last visit, she has not had any problems.  She remains on ozempic without any side effects including no nausea or vomiting.  Again, her insurance quit covering Januvia.  She was diagnosed with T2 diabetes in 2014 when she was 42 years old. She used to see endocrinology but has not seen them for years.  She remains on Lantus 36 Units daily,  ozempic 2mg  subcut weekly and metformin ER 500mg  once a day, and novolog 10 Units daily with each meal.  However, she states she does not eat full meals so she does not take the novolog.  She also has problems at times with Metformin ER where she will have diarrhea so she does not take this all the time.   She is not walking as much as they would like. She specifically denies unexplained abdominal pain, nausea or vomiting and documented hypoglycemia. She checks blood sugars once day and they tend to range somewhere between 100 and 200 mg/dl. She came in fasting today in anticipation of lab work. Her last HgbA1c was done in 04/2022 and was 8.8%.   I also started her on kerendia this past year.  I also started her on low dose Ramipril for her diabetes and also stared her on a statin this past year.  There is no long term complications of diabetic retinopathy, neuropathy, nephropathy or cardiovascular disease.  Her last dilated diabetic eye exam was done on 09/17/2022 and this showed no evidence of diabetic retinopathy.   The patient also has a history of iron deficiency anemia due to blood loss from uterine fibroids.  Her period will last 7-12 days and is heavy.  I saw her in 04/2022 where her iron counts were low.  We started her on iron  sulfate 325mg  BID.  She took up to 09/2022 but stopped the iron secondary to constipation and gas.    Nancy Nunez also comes in today concerned about black mold in her apartment.  This discovered this in the air conditioner but when she comes into the apartment, she gets instantly stuffed up with sinus congestion and headaches, runny nose, and post nasal drip.  She wishes to be sent to an allergist.        Past Medical History Past Medical History:  Diagnosis Date   Anemia    Anxiety    COVID-19 12/2020   Diabetes mellitus without complication (HCC)    Family history of adverse reaction to anesthesia    unknown reaction (mother) when waking up after kidney transplant   GERD (gastroesophageal reflux disease)      Allergies Allergies  Allergen Reactions   Bee Venom Anaphylaxis   Peanut-Containing Drug Products Anaphylaxis, Swelling and Other (See Comments)    Pt states she felt like something was in her throat after eating peanuts.   Pineapple Anaphylaxis and Hives   Latex Hives, Itching, Swelling and Other (See Comments)   Canagliflozin Other (See Comments)   Metformin Hcl Other (See Comments)   Pineapple Extract Other (See Comments)   Trazodone Other (See Comments)     Medications  Current Outpatient Medications:  Insulin Pen Needle (BD PEN NEEDLE NANO 2ND GEN) 32G X 4 MM MISC, as directed subcutaneous Once a day for 30 days, Disp: , Rfl:    albuterol (VENTOLIN HFA) 108 (90 Base) MCG/ACT inhaler, Inhale 1-2 puffs into the lungs every 6 (six) hours as needed for wheezing or shortness of breath., Disp: , Rfl:    ALPRAZolam (XANAX) 0.25 MG tablet, Take 0.25 mg by mouth daily as needed for anxiety., Disp: , Rfl:    Ascorbic Acid (VITAMIN C) 1000 MG tablet, Take 1,000 mg by mouth daily., Disp: , Rfl:    ASHWAGANDHA-RHODIOLA PO, Take 1 tablet by mouth daily., Disp: , Rfl:    cetirizine (ZYRTEC) 10 MG tablet, Take 10 mg by mouth daily., Disp: , Rfl:    cyclobenzaprine (FLEXERIL)  10 MG tablet, Take 10 mg by mouth 3 (three) times daily as needed for muscle spasms., Disp: , Rfl:    famotidine (PEPCID) 20 MG tablet, Take 20 mg by mouth 2 (two) times daily., Disp: , Rfl:    KERENDIA 20 MG TABS, TAKE 1 TABLET BY MOUTH EVERY DAY, Disp: 90 tablet, Rfl: 3   LEVEMIR FLEXPEN 100 UNIT/ML FlexPen, Inject into the skin., Disp: , Rfl:    lovastatin (MEVACOR) 10 MG tablet, 10 mg at bedtime., Disp: , Rfl:    metFORMIN (GLUCOPHAGE-XR) 500 MG 24 hr tablet, TAKE 1 TABLET BY MOUTH ONCE DAILY WITH THE EVENING MEAL, Disp: 90 tablet, Rfl: 1   Multiple Vitamin (MULTI VITAMIN) TABS, 1 tablet Orally Once a day for 30 day(s), Disp: , Rfl:    Multiple Vitamin (MULTIVITAMIN WITH MINERALS) TABS tablet, Take 1 tablet by mouth daily., Disp: , Rfl:    OZEMPIC, 2 MG/DOSE, 8 MG/3ML SOPN, INJECT 2 MG INTO THE SKIN ONCE A WEEK., Disp: 3 mL, Rfl: 3   Vitamin D, Cholecalciferol, 25 MCG (1000 UT) TABS, Take 1 tablet by mouth daily., Disp: , Rfl:    Zinc 50 MG TABS, Take 1 tablet by mouth daily., Disp: , Rfl:    Review of Systems Review of Systems  Constitutional:  Negative for chills and fever.  Eyes:  Negative for blurred vision and double vision.  Respiratory:  Negative for cough and shortness of breath.   Cardiovascular:  Negative for chest pain, palpitations and leg swelling.  Gastrointestinal:  Negative for abdominal pain, constipation, diarrhea, nausea and vomiting.  Genitourinary:  Negative for frequency.  Musculoskeletal:  Negative for myalgias.  Skin:  Negative for itching and rash.  Neurological:  Negative for dizziness, weakness and headaches.  Endo/Heme/Allergies:  Negative for polydipsia.       Objective:    Vitals BP 118/74 (BP Location: Right Arm, Patient Position: Sitting, Cuff Size: Normal)   Pulse (!) 118   Temp 97.9 F (36.6 C) (Temporal)   Resp 18   Ht 5\' 4"  (1.626 m)   Wt 178 lb 12.8 oz (81.1 kg)   SpO2 97%   BMI 30.69 kg/m    Physical Examination Physical Exam      Assessment & Plan:   Inadequately controlled diabetes mellitus (HCC) We will check her HGBa1c and adjust her meds as necessary.  Iron deficiency anemia We will recheck her labs again today including iron studies.  Allergies We will refer her to allergy for evaluation.    Return in about 3 months (around 03/25/2023).   Crist Fat, MD

## 2022-12-24 LAB — HEMOGLOBIN A1C
Est. average glucose Bld gHb Est-mCnc: 197 mg/dL
Hgb A1c MFr Bld: 8.5 % — ABNORMAL HIGH (ref 4.8–5.6)

## 2022-12-24 LAB — CBC WITH DIFFERENTIAL/PLATELET
Basophils Absolute: 0 10*3/uL (ref 0.0–0.2)
Basos: 1 %
EOS (ABSOLUTE): 0.1 10*3/uL (ref 0.0–0.4)
Eos: 1 %
Hematocrit: 32.9 % — ABNORMAL LOW (ref 34.0–46.6)
Hemoglobin: 9.8 g/dL — ABNORMAL LOW (ref 11.1–15.9)
Immature Grans (Abs): 0 10*3/uL (ref 0.0–0.1)
Immature Granulocytes: 0 %
Lymphocytes Absolute: 2.5 10*3/uL (ref 0.7–3.1)
Lymphs: 41 %
MCH: 23 pg — ABNORMAL LOW (ref 26.6–33.0)
MCHC: 29.8 g/dL — ABNORMAL LOW (ref 31.5–35.7)
MCV: 77 fL — ABNORMAL LOW (ref 79–97)
Monocytes Absolute: 0.3 10*3/uL (ref 0.1–0.9)
Monocytes: 5 %
Neutrophils Absolute: 3.2 10*3/uL (ref 1.4–7.0)
Neutrophils: 52 %
Platelets: 387 10*3/uL (ref 150–450)
RBC: 4.27 x10E6/uL (ref 3.77–5.28)
RDW: 14.6 % (ref 11.7–15.4)
WBC: 6.1 10*3/uL (ref 3.4–10.8)

## 2022-12-24 LAB — FERRITIN: Ferritin: 6 ng/mL — ABNORMAL LOW (ref 15–150)

## 2022-12-24 LAB — IRON: Iron: 21 ug/dL — ABNORMAL LOW (ref 27–159)

## 2023-01-09 ENCOUNTER — Other Ambulatory Visit: Payer: Self-pay

## 2023-01-09 MED ORDER — ACCRUFER 30 MG PO CAPS: 30.0000 mg | ORAL_CAPSULE | Freq: Two times a day (BID) | ORAL | 5 refills | Status: DC

## 2023-01-09 NOTE — Progress Notes (Signed)
She needs an iron infusion set up at [facility] with ferre heme. Start her also on accufer 30mg  BID.  Pt notified

## 2023-02-14 ENCOUNTER — Other Ambulatory Visit: Payer: Self-pay

## 2023-02-14 DIAGNOSIS — D509 Iron deficiency anemia, unspecified: Secondary | ICD-10-CM | POA: Diagnosis not present

## 2023-02-14 MED ORDER — TRIAMCINOLONE ACETONIDE 0.1 % EX CREA: 1.0000 | TOPICAL_CREAM | Freq: Two times a day (BID) | CUTANEOUS | 5 refills | Status: AC

## 2023-02-21 DIAGNOSIS — D509 Iron deficiency anemia, unspecified: Secondary | ICD-10-CM | POA: Diagnosis not present

## 2023-03-19 ENCOUNTER — Ambulatory Visit: Payer: Self-pay | Admitting: Allergy and Immunology

## 2023-03-31 ENCOUNTER — Encounter: Payer: Self-pay | Admitting: Internal Medicine

## 2023-03-31 ENCOUNTER — Ambulatory Visit: Payer: Federal, State, Local not specified - PPO | Admitting: Internal Medicine

## 2023-03-31 VITALS — BP 118/78 | HR 98 | Temp 97.5°F | Resp 20 | Ht 64.0 in | Wt 180.2 lb

## 2023-03-31 DIAGNOSIS — D509 Iron deficiency anemia, unspecified: Secondary | ICD-10-CM

## 2023-03-31 DIAGNOSIS — E1165 Type 2 diabetes mellitus with hyperglycemia: Secondary | ICD-10-CM | POA: Diagnosis not present

## 2023-03-31 MED ORDER — FLUTICASONE PROPIONATE 50 MCG/ACT NA SUSP: 1.0000 | Freq: Two times a day (BID) | NASAL | 2 refills | Status: DC

## 2023-03-31 MED ORDER — ACCRUFER 30 MG PO CAPS: 30.0000 mg | ORAL_CAPSULE | Freq: Two times a day (BID) | ORAL | 1 refills | Status: AC

## 2023-03-31 NOTE — Assessment & Plan Note (Signed)
We will check her HgBA1c and urine microalbumin/creatinine ratio.  Her diabetic foot exam was normal.

## 2023-03-31 NOTE — Progress Notes (Signed)
Office Visit  Subjective   Patient ID: Nancy Nunez   DOB: 1980/12/04   Age: 42 y.o.   MRN: [id number]   Chief Complaint Chief Complaint  Patient presents with   Follow-up    Follow up on hx of low iron     History of Present Illness The patient is a 42 year old African American/Black female who returns for a follow-up visit for her T2 diabetes.  On her last visit, her diabetes was not controlled and I asked her to increase her lantus to 42 Units daily.  Since her last visit, she has not had any problems.  She remains on ozempic without any side effects including no nausea or vomiting.  Again, her insurance quit covering Januvia.  She was diagnosed with T2 diabetes in 2014 when she was 42 years old. She used to see endocrinology but has not seen them for years.  She remains on Lantus 42 Units daily, ozempic 2mg  subcut weekly and she is supposed to be on metformin ER 500mg  once a day but stopped this due to diarrhea.  She is also supposed to be on  novolog 10 Units daily with each meal but she states she eats several small meals during the day.  She just started back going to the gym to work out.  She specifically denies unexplained abdominal pain, nausea or vomiting and documented hypoglycemia. She checks blood sugars once day and they tend to range somewhere between 150 and 2100 mg/dl. She came in fasting today in anticipation of lab work. Her last HgbA1c was done in 3 months ago and was 8.5%.   I also started her on kerendia this past year but her insurance would not cover it and she does not have protein in her urine.  I also started her on low dose Ramipril for her diabetes and also stared her on a statin this past year.  There is no long term complications of diabetic retinopathy, neuropathy, nephropathy or cardiovascular disease.  Her last dilated diabetic eye exam was done on 09/17/2022 and this showed no evidence of diabetic retinopathy.   The patient also has a history of iron deficiency  anemia due to blood loss from uterine fibroids.  I saw her in 04/2022 where her iron counts were low.  We started her on iron sulfate 325mg  BID.  She took up to 09/2022 but stopped the iron secondary to constipation and gas.  Over the interim, we did set her up for 2 ferrreheme iv infusions.  She has been on accufer 30mg  BID.  There has been no change to her periods.  She has been on BCP's in the past but this has made her FSBS elevated.  She states she has discussed multiple options with her GYN and she is inclined about a copper IUD.        Past Medical History Past Medical History:  Diagnosis Date   Anemia    Anxiety    COVID-19 12/2020   Diabetes mellitus without complication (HCC)    Family history of adverse reaction to anesthesia    unknown reaction (mother) when waking up after kidney transplant   GERD (gastroesophageal reflux disease)      Allergies Allergies  Allergen Reactions   Bee Venom Anaphylaxis   Peanut-Containing Drug Products Anaphylaxis, Swelling and Other (See Comments)    Pt states she felt like something was in her throat after eating peanuts.   Pineapple Anaphylaxis and Hives   Latex Hives, Itching, Swelling  and Other (See Comments)   Canagliflozin Other (See Comments)   Metformin Hcl Other (See Comments)   Pineapple Extract Other (See Comments)   Trazodone Other (See Comments)     Medications  Current Outpatient Medications:    albuterol (VENTOLIN HFA) 108 (90 Base) MCG/ACT inhaler, Inhale 1-2 puffs into the lungs every 6 (six) hours as needed for wheezing or shortness of breath., Disp: 1 each, Rfl: 2   ALPRAZolam (XANAX) 0.25 MG tablet, Take 1 tablet (0.25 mg total) by mouth at bedtime as needed for anxiety., Disp: 30 tablet, Rfl: 2   Ascorbic Acid (VITAMIN C) 1000 MG tablet, Take 1,000 mg by mouth daily., Disp: , Rfl:    ASHWAGANDHA-RHODIOLA PO, Take 1 tablet by mouth daily., Disp: , Rfl:    cetirizine (ZYRTEC) 10 MG tablet, Take 10 mg by mouth daily.,  Disp: , Rfl:    cyclobenzaprine (FLEXERIL) 10 MG tablet, Take 10 mg by mouth 3 (three) times daily as needed for muscle spasms., Disp: , Rfl:    famotidine (PEPCID) 20 MG tablet, Take 20 mg by mouth 2 (two) times daily., Disp: , Rfl:    Ferric Maltol (ACCRUFER) 30 MG CAPS, Take 1 capsule (30 mg total) by mouth in the morning and at bedtime., Disp: 180 capsule, Rfl: 1   fluticasone (FLONASE) 50 MCG/ACT nasal spray, Place 1 spray into both nostrils in the morning and at bedtime., Disp: 15.8 mL, Rfl: 2   Insulin Pen Needle (BD PEN NEEDLE NANO 2ND GEN) 32G X 4 MM MISC, as directed subcutaneous Once a day for 30 days, Disp: , Rfl:    KERENDIA 20 MG TABS, TAKE 1 TABLET BY MOUTH EVERY DAY, Disp: 90 tablet, Rfl: 3   LEVEMIR FLEXPEN 100 UNIT/ML FlexPen, Inject into the skin., Disp: , Rfl:    lovastatin (MEVACOR) 10 MG tablet, Take one tab po on Mon and thur, Disp: 24 tablet, Rfl: 3   metFORMIN (GLUCOPHAGE-XR) 500 MG 24 hr tablet, TAKE 1 TABLET BY MOUTH ONCE DAILY WITH THE EVENING MEAL, Disp: 90 tablet, Rfl: 1   Multiple Vitamin (MULTI VITAMIN) TABS, 1 tablet Orally Once a day for 30 day(s), Disp: , Rfl:    Multiple Vitamin (MULTIVITAMIN WITH MINERALS) TABS tablet, Take 1 tablet by mouth daily., Disp: , Rfl:    OZEMPIC, 2 MG/DOSE, 8 MG/3ML SOPN, INJECT 2 MG INTO THE SKIN ONCE A WEEK., Disp: 3 mL, Rfl: 3   triamcinolone cream (KENALOG) 0.1 %, Apply 1 Application topically 2 (two) times daily., Disp: 30 g, Rfl: 5   Vitamin D, Cholecalciferol, 25 MCG (1000 UT) TABS, Take 1 tablet by mouth daily., Disp: , Rfl:    Zinc 50 MG TABS, Take 1 tablet by mouth daily., Disp: , Rfl:    Review of Systems Review of Systems  Constitutional:  Negative for chills and fever.  Eyes:  Negative for blurred vision and double vision.  Respiratory:  Negative for cough and shortness of breath.   Cardiovascular:  Negative for chest pain, palpitations and leg swelling.  Gastrointestinal:  Negative for abdominal pain, blood in  stool, constipation, diarrhea, melena, nausea and vomiting.  Genitourinary:  Negative for frequency and hematuria.  Musculoskeletal:  Positive for myalgias.  Neurological:  Negative for dizziness, weakness and headaches.  Endo/Heme/Allergies:  Negative for polydipsia.       Objective:    Vitals BP 118/78 (BP Location: Right Arm, Patient Position: Sitting)   Pulse 98   Temp (!) 97.5 F (36.4 C) (Temporal)  Resp 20   Ht 5\' 4"  (1.626 m)   Wt 180 lb 4 oz (81.8 kg)   SpO2 99%   BMI 30.94 kg/m    Physical Examination Physical Exam Constitutional:      Appearance: Normal appearance. She is not ill-appearing.  Cardiovascular:     Rate and Rhythm: Normal rate and regular rhythm.     Pulses: Normal pulses.     Heart sounds: No murmur heard.    No friction rub. No gallop.  Pulmonary:     Effort: Pulmonary effort is normal. No respiratory distress.     Breath sounds: No wheezing, rhonchi or rales.  Abdominal:     General: Bowel sounds are normal. There is no distension.     Palpations: Abdomen is soft.     Tenderness: There is no abdominal tenderness.  Musculoskeletal:     Right lower leg: No edema.     Left lower leg: No edema.  Skin:    General: Skin is warm and dry.     Findings: No rash.  Neurological:     Mental Status: She is alert.        Assessment & Plan:   Inadequately controlled diabetes mellitus (HCC) We will check her HgBA1c and urine microalbumin/creatinine ratio.  Her diabetic foot exam was normal.    Iron deficiency anemia We will check her CBC and iron studies today.  We will refill her accufer.    Return in about 3 months (around 07/01/2023).   Crist Fat, MD

## 2023-03-31 NOTE — Assessment & Plan Note (Signed)
We will check her CBC and iron studies today.  We will refill her accufer.

## 2023-04-01 LAB — CMP14 + ANION GAP
ALT: 22 [IU]/L (ref 0–32)
AST: 21 [IU]/L (ref 0–40)
Albumin: 4.4 g/dL (ref 3.9–4.9)
Alkaline Phosphatase: 58 [IU]/L (ref 44–121)
Anion Gap: 14 mmol/L (ref 10.0–18.0)
BUN/Creatinine Ratio: 14 (ref 9–23)
BUN: 9 mg/dL (ref 6–24)
Bilirubin Total: <0.2 mg/dL (ref 0.0–1.2)
CO2: 20 mmol/L (ref 20–29)
Calcium: 9.1 mg/dL (ref 8.7–10.2)
Chloride: 104 mmol/L (ref 96–106)
Creatinine, Ser: 0.63 mg/dL (ref 0.57–1.00)
Globulin, Total: 2.6 g/dL (ref 1.5–4.5)
Glucose: 157 mg/dL — ABNORMAL HIGH (ref 70–99)
Potassium: 4.2 mmol/L (ref 3.5–5.2)
Sodium: 138 mmol/L (ref 134–144)
Total Protein: 7 g/dL (ref 6.0–8.5)
eGFR: 113 mL/min/{1.73_m2} (ref >59)

## 2023-04-01 LAB — CBC WITH DIFFERENTIAL/PLATELET
Basophils Absolute: 0 10*3/uL (ref 0.0–0.2)
Basos: 0 %
EOS (ABSOLUTE): 0 10*3/uL (ref 0.0–0.4)
Eos: 1 %
Hematocrit: 37.1 % (ref 34.0–46.6)
Hemoglobin: 12 g/dL (ref 11.1–15.9)
Immature Grans (Abs): 0 10*3/uL (ref 0.0–0.1)
Immature Granulocytes: 0 %
Lymphocytes Absolute: 2.5 10*3/uL (ref 0.7–3.1)
Lymphs: 45 %
MCH: 27.4 pg (ref 26.6–33.0)
MCHC: 32.3 g/dL (ref 31.5–35.7)
MCV: 85 fL (ref 79–97)
Monocytes Absolute: 0.3 10*3/uL (ref 0.1–0.9)
Monocytes: 6 %
Neutrophils Absolute: 2.6 10*3/uL (ref 1.4–7.0)
Neutrophils: 48 %
Platelets: 295 10*3/uL (ref 150–450)
RBC: 4.38 x10E6/uL (ref 3.77–5.28)
RDW: 21 % — ABNORMAL HIGH (ref 11.7–15.4)
WBC: 5.4 10*3/uL (ref 3.4–10.8)

## 2023-04-01 LAB — MICROALBUMIN / CREATININE URINE RATIO
Creatinine, Urine: 180.2 mg/dL
Microalb/Creat Ratio: 5 mg/g{creat} (ref 0–29)
Microalbumin, Urine: 9.5 ug/mL

## 2023-04-01 LAB — HEMOGLOBIN A1C
Est. average glucose Bld gHb Est-mCnc: 183 mg/dL
Hgb A1c MFr Bld: 8 % — ABNORMAL HIGH (ref 4.8–5.6)

## 2023-04-01 LAB — IRON: Iron: 99 ug/dL (ref 27–159)

## 2023-04-01 LAB — FERRITIN: Ferritin: 143 ng/mL (ref 15–150)

## 2023-04-06 ENCOUNTER — Other Ambulatory Visit: Payer: Self-pay | Admitting: Internal Medicine

## 2023-04-18 ENCOUNTER — Ambulatory Visit: Payer: Federal, State, Local not specified - PPO

## 2023-04-21 ENCOUNTER — Ambulatory Visit: Payer: Federal, State, Local not specified - PPO

## 2023-05-05 ENCOUNTER — Encounter: Payer: Self-pay | Admitting: Allergy and Immunology

## 2023-05-05 ENCOUNTER — Other Ambulatory Visit: Payer: Self-pay | Admitting: Internal Medicine

## 2023-05-05 ENCOUNTER — Ambulatory Visit: Payer: Federal, State, Local not specified - PPO | Admitting: Allergy and Immunology

## 2023-05-05 VITALS — BP 132/78 | HR 92 | Resp 14 | Ht 64.4 in | Wt 178.0 lb

## 2023-05-05 DIAGNOSIS — Z91018 Allergy to other foods: Secondary | ICD-10-CM

## 2023-05-05 DIAGNOSIS — R140 Abdominal distension (gaseous): Secondary | ICD-10-CM

## 2023-05-05 DIAGNOSIS — J4520 Mild intermittent asthma, uncomplicated: Secondary | ICD-10-CM | POA: Diagnosis not present

## 2023-05-05 DIAGNOSIS — K219 Gastro-esophageal reflux disease without esophagitis: Secondary | ICD-10-CM | POA: Diagnosis not present

## 2023-05-05 DIAGNOSIS — J301 Allergic rhinitis due to pollen: Secondary | ICD-10-CM | POA: Diagnosis not present

## 2023-05-05 DIAGNOSIS — J3089 Other allergic rhinitis: Secondary | ICD-10-CM

## 2023-05-05 MED ORDER — OMEPRAZOLE 40 MG PO CPDR: 40.0000 mg | DELAYED_RELEASE_CAPSULE | Freq: Every morning | ORAL | 5 refills | Status: DC

## 2023-05-05 MED ORDER — RYALTRIS 665-25 MCG/ACT NA SUSP: NASAL | 5 refills | Status: DC

## 2023-05-05 MED ORDER — ALPRAZOLAM 0.25 MG PO TABS: 0.2500 mg | ORAL_TABLET | Freq: Every evening | ORAL | 2 refills | Status: AC | PRN

## 2023-05-05 MED ORDER — AIRSUPRA 90-80 MCG/ACT IN AERO: 2.0000 | INHALATION_SPRAY | RESPIRATORY_TRACT | 1 refills | Status: AC | PRN

## 2023-05-05 MED ORDER — EPINEPHRINE 0.3 MG/0.3ML IJ SOAJ: INTRAMUSCULAR | 3 refills | Status: DC

## 2023-05-05 MED ORDER — FAMOTIDINE 40 MG PO TABS: ORAL_TABLET | ORAL | 5 refills | Status: DC

## 2023-05-05 NOTE — Patient Instructions (Addendum)
  1. Return for skin testing (no antihistamines)  2. Blood - celiac screen w/ IgA. Nut panel w/ R, wheat IgE, Soy IgE, Pineapple IgE  3. Treat and prevent inflammation of airway:   A. Ryaltris - 2 sprays each nostril 1-2 times per day  4. Treat and prevent reflux induced inflammation of airway:   A. Omeprazole 40 mg - 1 tablet in AM  B. Famotidine 40 mg - 1 tablet in PM  C. Decrease caffeine / chocolate consumption  D. Replace throat clearing with swallowing / drinking maneuver  5. If needed:   A. Airsupra - 2 inhalations every 6 hours (replaces albuterol)  B. Antihistamines  C. Pataday - 2 drop each eye 1 time per day

## 2023-05-05 NOTE — Progress Notes (Signed)
[place] - [place] - [place]   Dear Dr. Leonia Nunez,  Thank you for referring Nancy Nunez to the [facility] [place] on 05/05/2023.   Below is a summation of this patient's evaluation and recommendations.  Thank you for your referral. I will keep you informed about this patient's response to treatment.   If you have any questions please do not hesitate to contact me.   Sincerely,  Nancy Priest, MD Allergy / Immunology [facility]   ______________________________________________________________________    NEW PATIENT NOTE  Referring Provider: Crist Fat, MD Primary Provider: Leonia Nunez, Nancy Cower, MD Date of office visit: 05/05/2023    Subjective:   Chief Complaint:  Nancy Nunez (DOB: Nov 06, 1980) is a 42 y.o. female who presents to the clinic on 05/05/2023 with a chief complaint of Sinus Problem .     HPI: Nancy Nunez presents to the clinic in evaluation of several issues.  First, she has constant drainage in her throat and throat clearing especially when she lays down at night.  She does have a history of reflux disease and is currently treating this condition with Pepcid twice a day yet still continues to have regurgitation and bad taste in her mouth at nighttime.  She has chocolate once a week and has alcohol about 3 times per month and has coffee most days of the week.  Second, she has a history of nose blowing and nasal congestion and sneezing and itchy red watery eyes that occurs on a perennial basis and flares during the spring.  She was on immunotherapy until 2015 which she thought did help her somewhat while she was using that form of treatments.  She continues to use Flonase on a pretty consistent basis as well as some antihistamines.  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  When she was in grade school she was given pineapple and developed  whelps and she has not consumed any pineapple since that point in time.  When she eats peanut she gets a knot in her throat and she has not eaten that form of the food in many years.  When she eats wheat or soy she gets abdominal bloating and nausea.  There was a point in time when she was gluten-free and she actually did a lot better when being in a gluten-free state.  Fourth, she carries the diagnosis of asthma that appeared to develop after having COVID several years ago.  She will have some shortness of breath without any significant wheezing or coughing and she currently does not have any exercise-induced bronchospastic symptoms or cold air induced bronchospastic symptoms.  She was given an albuterol inhaler around the time of her active COVID but she now has minimal use of this medication.  She is receiving her flu vaccine this week.  Past Medical History:  Diagnosis Date   Anemia    Anxiety    COVID-19 12/2020   Diabetes mellitus without complication (HCC)    Family history of adverse reaction to anesthesia    unknown reaction (mother) when waking up after kidney transplant   GERD (gastroesophageal reflux disease)     Past Surgical History:  Procedure Laterality Date   CHOLECYSTECTOMY     ESOPHAGOGASTRODUODENOSCOPY     x3   GYNECOLOGIC CRYOSURGERY     PAROTIDECTOMY Left 02/13/2022   Procedure: PAROTIDECTOMY;  Surgeon: Nancy Murders, MD;  Location: [facility] ORS;  Service: ENT;  Laterality: Left;   SUPERFICIAL LYMPH NODE BIOPSY / EXCISION      Allergies as of 05/05/2023       Reactions   Bee Venom Anaphylaxis   Peanut-containing Drug Products Anaphylaxis, Swelling, Other (See Comments)   Pt states she felt like something was in her throat after eating peanuts.   Pineapple Anaphylaxis, Hives   Latex Hives, Itching, Swelling, Other (See Comments)   Canagliflozin Other (See Comments)   Metformin Hcl Other (See Comments)   Pineapple Extract Other (See Comments)   Trazodone Other  (See Comments)        Medication List    ACCRUFeR 30 MG Caps Generic drug: Ferric Maltol Take 1 capsule (30 mg total) by mouth in the morning and at bedtime.   albuterol 108 (90 Base) MCG/ACT inhaler Commonly known as: VENTOLIN HFA Inhale 1-2 puffs into the lungs every 6 (six) hours as needed for wheezing or shortness of breath.   ALPRAZolam 0.25 MG tablet Commonly known as: XANAX Take 1 tablet (0.25 mg total) by mouth at bedtime as needed for anxiety.   BD Pen Needle Nano 2nd Gen 32G X 4 MM Misc Generic drug: Insulin Pen Needle as directed subcutaneous Once a day for 30 days   cetirizine 10 MG tablet Commonly known as: ZYRTEC Take 10 mg by mouth daily.   cyclobenzaprine 10 MG tablet Commonly known as: FLEXERIL Take 10 mg by mouth 3 (three) times daily as needed for muscle spasms.   EPINEPHrine 0.3 mg/0.3 mL Soaj injection Commonly known as: EpiPen 2-Pak Use as directed for life-threatening allergic reaction. Started by: Nancy Nunez Nancy Nancy Nunez   famotidine 20 MG tablet Commonly known as: PEPCID Take one tablet by mouth 2 times per day.   Levemir FlexPen 100 UNIT/ML FlexPen Generic drug: insulin detemir Inject into the skin.   lovastatin 10 MG tablet Commonly known as: MEVACOR Take one tab po on Mon and thur   multivitamin with minerals Tabs tablet Take 1 tablet by mouth daily.   omeprazole 40 MG capsule Commonly known as: PRILOSEC Take 1 capsule (40 mg total) by mouth in the morning. Started by: Nancy Nunez Nancy Nunez   Ozempic (2 MG/DOSE) 8 MG/3ML Sopn Generic drug: Semaglutide (2 MG/DOSE) INJECT 2 MG INTO THE SKIN ONCE A WEEK.   Ryaltris 284-13 MCG/ACT Susp Generic drug: Olopatadine-Mometasone Use two sprays in each nostril one to two times per day as directed. Started by: Nancy Nunez Nancy Nancy Nunez   triamcinolone cream 0.1 % Commonly known as: KENALOG Apply 1 Application topically 2 (two) times daily.   vitamin C 1000 MG tablet Take 1,000 mg by mouth daily.   Vitamin D  (Cholecalciferol) 25 MCG (1000 UT) Tabs Take 1 tablet by mouth daily.   Zinc 50 MG Tabs Take 1 tablet by mouth daily.    Review of systems negative except as noted in HPI / PMHx or noted below:  Review of Systems  Constitutional: Negative.   HENT: Negative.    Eyes: Negative.   Respiratory: Negative.    Cardiovascular: Negative.   Gastrointestinal: Negative.   Genitourinary: Negative.   Musculoskeletal: Negative.   Skin: Negative.   Neurological: Negative.   Endo/Heme/Allergies: Negative.   Psychiatric/Behavioral: Negative.      Family History  Problem Relation Age of Onset   Diabetes Mother    High Cholesterol Mother    Heart disease Mother    Epilepsy Mother    High Cholesterol Father    Diabetes Father    Hypertension Father  Throat cancer Maternal Grandfather     Social History   Socioeconomic History   Marital status: Single    Spouse name: Not on file   Number of children: Not on file   Years of education: Not on file   Highest education level: Not on file  Occupational History   Not on file  Tobacco Use   Smoking status: Never   Smokeless tobacco: Never  Vaping Use   Vaping status: Never Used  Substance and Sexual Activity   Alcohol use: Yes   Drug use: Never   Sexual activity: Not on file  Other Topics Concern   Not on file  Social History Narrative   Not on file   Environmental and Social history  Lives in a apartment with a dry environment, no animals located inside the household, carpet in the bedroom, plastic on the bed, plastic on the pillow, no smoking ongoing with inside the household.  She is an LPN providing primary patient care.  Objective:   Vitals:   05/05/23 0955  BP: 132/78  Pulse: 92  Resp: 14  SpO2: 97%   Height: 5' 4.4" (163.6 cm) Weight: 178 lb (80.7 kg)  Physical Exam Constitutional:      Appearance: She is not diaphoretic.  HENT:     Head: Normocephalic.     Right Ear: Tympanic membrane, ear canal and  external ear normal.     Left Ear: Tympanic membrane, ear canal and external ear normal.     Nose: Nose normal. No mucosal edema or rhinorrhea.     Mouth/Throat:     Pharynx: Uvula midline. No oropharyngeal exudate.  Eyes:     Conjunctiva/sclera: Conjunctivae normal.  Neck:     Thyroid: No thyromegaly.     Trachea: Trachea normal. No tracheal tenderness or tracheal deviation.  Cardiovascular:     Rate and Rhythm: Normal rate and regular rhythm.     Heart sounds: Normal heart sounds, S1 normal and S2 normal. No murmur heard. Pulmonary:     Effort: No respiratory distress.     Breath sounds: Normal breath sounds. No stridor. No wheezing or rales.  Lymphadenopathy:     Head:     Right side of head: No tonsillar adenopathy.     Left side of head: No tonsillar adenopathy.     Cervical: No cervical adenopathy.  Skin:    Findings: No erythema or rash.     Nails: There is no clubbing.  Neurological:     Mental Status: She is alert.     Diagnostics: Allergy skin tests were performed.   Spirometry was performed and demonstrated an FEV1 of 1.83 @ 73 % of predicted. FEV1/FVC = 0.86b  Results of blood tests obtained 31 March 2023 identifies WBC 5.4, absolute eosinophil 0, absolute lymphocyte 2500, hemoglobin 12.0, platelet 295, creatinine 0.63 MGs/DL, AST 16X/W, ALT 96E/A  Assessment and Plan:    1. Asthma, mild intermittent, well-controlled   2. Perennial allergic rhinitis   3. Seasonal allergic rhinitis due to pollen   4. LPRD (laryngopharyngeal reflux disease)   5. Food allergy   6. Abdominal bloating    1. Return for skin testing (no antihistamines)  2. Blood - celiac screen w/ IgA. Nut panel w/ R, wheat IgE, Soy IgE, Pineapple IgE  3. Treat and prevent inflammation of airway:   A. Ryaltris - 2 sprays each nostril 1-2 times per day  4. Treat and prevent reflux induced inflammation of airway:   A. Omeprazole 40  mg - 1 tablet in AM  B. Famotidine 40 mg - 1 tablet in  PM  C. Decrease caffeine / chocolate consumption  D. Replace throat clearing with swallowing / drinking maneuver  5. If needed:   A. Airsupra - 2 inhalations every 6 hours (replaces albuterol)  B. Antihistamines  C. Pataday - 2 drop each eye 1 time per day  Aleda appears to have some atopic disease affecting her respiratory tract and may also be affecting her GI tract and we will work through this issue by having her return for skin tests and we will also obtain some blood test and investigation of food hypersensitivity and gluten hypersensitivity as noted above.  Her history sounds as though she is having irritation and inflammation of her airway as a function of not just her atopic disease but also reflux induced respiratory disease and we are going to have her be a little bit more aggressive about treating her reflux as noted above.  I will see her back in this clinic for skin testing.  Nancy Priest, MD Allergy / Immunology  [facility] [place]

## 2023-05-08 LAB — ALLERGEN, PINEAPPLE, F210

## 2023-05-08 LAB — CELIAC DISEASE PANEL: IgA/Immunoglobulin A, Serum: 226 mg/dL (ref 87–352)

## 2023-05-08 LAB — ALLERGEN, WHEAT, F4

## 2023-05-08 LAB — IGE NUT PROF. W/COMPONENT RFLX: Macadamia Nut, IgE: 1.84 kU/L — AB

## 2023-05-08 LAB — ALLERGEN SOYBEAN

## 2023-05-12 ENCOUNTER — Ambulatory Visit: Payer: Self-pay | Admitting: Allergy and Immunology

## 2023-05-13 NOTE — Progress Notes (Signed)
Start her on farxiga 10mg  daily. Her iron studies looked good.  Patient is aware of Labs

## 2023-05-16 DIAGNOSIS — I639 Cerebral infarction, unspecified: Secondary | ICD-10-CM | POA: Diagnosis not present

## 2023-05-16 DIAGNOSIS — R030 Elevated blood-pressure reading, without diagnosis of hypertension: Secondary | ICD-10-CM | POA: Diagnosis not present

## 2023-05-16 DIAGNOSIS — R29818 Other symptoms and signs involving the nervous system: Secondary | ICD-10-CM | POA: Diagnosis not present

## 2023-05-16 DIAGNOSIS — E119 Type 2 diabetes mellitus without complications: Secondary | ICD-10-CM | POA: Diagnosis not present

## 2023-05-16 DIAGNOSIS — H748X2 Other specified disorders of left middle ear and mastoid: Secondary | ICD-10-CM | POA: Diagnosis not present

## 2023-05-16 DIAGNOSIS — Z7985 Long-term (current) use of injectable non-insulin antidiabetic drugs: Secondary | ICD-10-CM | POA: Diagnosis not present

## 2023-05-16 DIAGNOSIS — I6782 Cerebral ischemia: Secondary | ICD-10-CM | POA: Diagnosis not present

## 2023-05-16 DIAGNOSIS — R29700 NIHSS score 0: Secondary | ICD-10-CM | POA: Diagnosis not present

## 2023-05-16 DIAGNOSIS — R200 Anesthesia of skin: Secondary | ICD-10-CM | POA: Diagnosis not present

## 2023-05-16 DIAGNOSIS — E669 Obesity, unspecified: Secondary | ICD-10-CM | POA: Diagnosis not present

## 2023-05-16 DIAGNOSIS — Q2112 Patent foramen ovale: Secondary | ICD-10-CM | POA: Diagnosis not present

## 2023-05-16 DIAGNOSIS — Z6829 Body mass index (BMI) 29.0-29.9, adult: Secondary | ICD-10-CM | POA: Diagnosis not present

## 2023-05-16 DIAGNOSIS — G459 Transient cerebral ischemic attack, unspecified: Secondary | ICD-10-CM | POA: Diagnosis not present

## 2023-05-16 DIAGNOSIS — Z794 Long term (current) use of insulin: Secondary | ICD-10-CM | POA: Diagnosis not present

## 2023-05-17 DIAGNOSIS — Z8673 Personal history of transient ischemic attack (TIA), and cerebral infarction without residual deficits: Secondary | ICD-10-CM | POA: Diagnosis not present

## 2023-05-17 DIAGNOSIS — Z7984 Long term (current) use of oral hypoglycemic drugs: Secondary | ICD-10-CM | POA: Diagnosis not present

## 2023-05-17 DIAGNOSIS — E119 Type 2 diabetes mellitus without complications: Secondary | ICD-10-CM | POA: Diagnosis not present

## 2023-05-17 DIAGNOSIS — H748X2 Other specified disorders of left middle ear and mastoid: Secondary | ICD-10-CM | POA: Diagnosis not present

## 2023-05-17 DIAGNOSIS — Z79899 Other long term (current) drug therapy: Secondary | ICD-10-CM | POA: Diagnosis not present

## 2023-05-17 DIAGNOSIS — I639 Cerebral infarction, unspecified: Secondary | ICD-10-CM | POA: Diagnosis not present

## 2023-05-18 DIAGNOSIS — I639 Cerebral infarction, unspecified: Secondary | ICD-10-CM | POA: Diagnosis not present

## 2023-05-19 ENCOUNTER — Encounter: Payer: Self-pay | Admitting: Internal Medicine

## 2023-05-19 ENCOUNTER — Ambulatory Visit: Payer: Federal, State, Local not specified - PPO | Admitting: Internal Medicine

## 2023-05-19 ENCOUNTER — Ambulatory Visit: Payer: Self-pay | Admitting: Allergy and Immunology

## 2023-05-19 VITALS — BP 120/82 | HR 102 | Temp 96.0°F | Resp 18 | Ht 64.0 in | Wt 178.8 lb

## 2023-05-19 DIAGNOSIS — G459 Transient cerebral ischemic attack, unspecified: Secondary | ICD-10-CM

## 2023-05-19 DIAGNOSIS — Q2112 Patent foramen ovale: Secondary | ICD-10-CM | POA: Insufficient documentation

## 2023-05-19 DIAGNOSIS — M791 Myalgia, unspecified site: Secondary | ICD-10-CM | POA: Insufficient documentation

## 2023-05-19 DIAGNOSIS — E1165 Type 2 diabetes mellitus with hyperglycemia: Secondary | ICD-10-CM | POA: Diagnosis not present

## 2023-05-19 HISTORY — DX: Transient cerebral ischemic attack, unspecified: G45.9

## 2023-05-19 HISTORY — DX: Patent foramen ovale: Q21.12

## 2023-05-19 HISTORY — DX: Myalgia, unspecified site: M79.10

## 2023-05-19 MED ORDER — LOVASTATIN 10 MG PO TABS: 10.0000 mg | ORAL_TABLET | Freq: Every day | ORAL | 3 refills | Status: DC

## 2023-05-19 NOTE — Progress Notes (Signed)
Office Visit  Subjective   Patient ID: Nancy Nunez   DOB: [birth year]   Age: 42 y.o.   MRN: [id number]   Chief Complaint No chief complaint on file.    History of Present Illness Nancy Nunez is a 42 yo female who comes in today for a hospital followup.  She states that on 05/16/2023 that she was walking to work when she began having a dull aching pain in her left shoulder that radiated down to her fingers.  She then began having numbness and tingling from her left shoulder down to her fingers and by the time she walked into the office, she was unable to lift her left arm and she had no grip strength in her left hand.  She immediately went to [facility] ER on 05/16/2023 where they did a code stroke CT of her head and angio of her neck.  Her CT of her head showed no evidence of acute intracranial abnormality. CT angio of the neck showed no occlusion or significant stenosis. She was unable to tolerate her initial MRI due to claustrophobia and they actually intubated her and sent her for a brain MRI which was done on 05/17/2023 and this showed no acute intracranial abnormality identified.  They did an ECHO that showed a normal LV size and normal LVEF of 60-65%.  They noted a possible PFO or pulmonary AVM and she was referred to thoracic surgery.    She was started on ASA 81mg  and lipitor that she received in the hospital but they did not send her home on this.  The discharge summary states she had a stroke but I believe she had a TIA.  Today, she is complaining of myalgias "all over" but no chest pain, palpitations, or focal neurologic deficit.     Past Medical History Past Medical History:  Diagnosis Date   Anemia    Anxiety    COVID-19 12/2020   Diabetes mellitus without complication (HCC)    Family history of adverse reaction to anesthesia    unknown reaction (mother) when waking up after kidney transplant   GERD (gastroesophageal reflux disease)      Allergies Allergies  Allergen Reactions    Bee Venom Anaphylaxis   Peanut-Containing Drug Products Anaphylaxis, Swelling and Other (See Comments)    Pt states she felt like something was in her throat after eating peanuts.   Pineapple Anaphylaxis and Hives   Latex Hives, Itching, Swelling and Other (See Comments)   Canagliflozin Other (See Comments)   Metformin Hcl Other (See Comments)   Pineapple Extract Other (See Comments)   Trazodone Other (See Comments)     Medications  Current Outpatient Medications:    AIRSUPRA 90-80 MCG/ACT AERO, Inhale 2 puffs into the lungs as needed (every 6 hours for cough, wheeze, shortness of breath.  Rinse, gargle, and spit after use)., Disp: 10.7 g, Rfl: 1   albuterol (VENTOLIN HFA) 108 (90 Base) MCG/ACT inhaler, Inhale 1-2 puffs into the lungs every 6 (six) hours as needed for wheezing or shortness of breath., Disp: 1 each, Rfl: 2   ALPRAZolam (XANAX) 0.25 MG tablet, Take 1 tablet (0.25 mg total) by mouth at bedtime as needed for anxiety., Disp: 30 tablet, Rfl: 2   Ascorbic Acid (VITAMIN C) 1000 MG tablet, Take 1,000 mg by mouth daily., Disp: , Rfl:    cetirizine (ZYRTEC) 10 MG tablet, Take 10 mg by mouth daily., Disp: , Rfl:    cyclobenzaprine (FLEXERIL) 10 MG tablet, Take 10 mg by mouth  3 (three) times daily as needed for muscle spasms. (Patient not taking: Reported on 05/05/2023), Disp: , Rfl:    EPINEPHrine (EPIPEN 2-PAK) 0.3 mg/0.3 mL IJ SOAJ injection, Use as directed for life-threatening allergic reaction., Disp: 2 each, Rfl: 3   famotidine (PEPCID) 40 MG tablet, Take one tablet by mouth every night., Disp: 30 tablet, Rfl: 5   Ferric Maltol (ACCRUFER) 30 MG CAPS, Take 1 capsule (30 mg total) by mouth in the morning and at bedtime., Disp: 180 capsule, Rfl: 1   Insulin Pen Needle (BD PEN NEEDLE NANO 2ND GEN) 32G X 4 MM MISC, as directed subcutaneous Once a day for 30 days, Disp: , Rfl:    LEVEMIR FLEXPEN 100 UNIT/ML FlexPen, Inject into the skin., Disp: , Rfl:    lovastatin (MEVACOR) 10 MG  tablet, Take one tab po on Mon and thur, Disp: 24 tablet, Rfl: 3   Multiple Vitamin (MULTIVITAMIN WITH MINERALS) TABS tablet, Take 1 tablet by mouth daily., Disp: , Rfl:    Olopatadine-Mometasone (RYALTRIS) 665-25 MCG/ACT SUSP, Use two sprays in each nostril one to two times per day as directed., Disp: 29 g, Rfl: 5   omeprazole (PRILOSEC) 40 MG capsule, Take 1 capsule (40 mg total) by mouth in the morning., Disp: 30 capsule, Rfl: 5   Semaglutide, 2 MG/DOSE, (OZEMPIC, 2 MG/DOSE,) 8 MG/3ML SOPN, INJECT 2 MG INTO THE SKIN ONCE A WEEK., Disp: 3 mL, Rfl: 1   triamcinolone cream (KENALOG) 0.1 %, Apply 1 Application topically 2 (two) times daily., Disp: 30 g, Rfl: 5   Vitamin D, Cholecalciferol, 25 MCG (1000 UT) TABS, Take 1 tablet by mouth daily., Disp: , Rfl:    Zinc 50 MG TABS, Take 1 tablet by mouth daily., Disp: , Rfl:    Review of Systems Review of Systems  Constitutional:  Negative for chills and fever.  Eyes:  Negative for blurred vision and double vision.  Respiratory:  Positive for cough. Negative for shortness of breath.   Cardiovascular:  Negative for chest pain, palpitations and leg swelling.  Gastrointestinal:  Negative for abdominal pain, constipation, diarrhea, nausea and vomiting.  Musculoskeletal:  Positive for myalgias.  Neurological:  Negative for dizziness, sensory change, speech change, focal weakness, weakness and headaches.       Objective:    Vitals BP 120/82   Pulse (!) 102   Temp (!) 96 F (35.6 C)   Resp 18   Ht 5\' 4"  (1.626 m)   Wt 178 lb 12.8 oz (81.1 kg)   SpO2 96%   BMI 30.69 kg/m    Physical Examination Physical Exam Constitutional:      Appearance: Normal appearance. She is not ill-appearing.  Cardiovascular:     Rate and Rhythm: Normal rate and regular rhythm.     Pulses: Normal pulses.     Heart sounds: No murmur heard.    No friction rub. No gallop.  Pulmonary:     Effort: Pulmonary effort is normal. No respiratory distress.     Breath  sounds: No wheezing, rhonchi or rales.  Abdominal:     General: Bowel sounds are normal. There is no distension.     Palpations: Abdomen is soft.     Tenderness: There is no abdominal tenderness.  Musculoskeletal:     Right lower leg: No edema.     Left lower leg: No edema.  Skin:    General: Skin is warm and dry.     Findings: No rash.  Neurological:  General: No focal deficit present.     Mental Status: She is alert and oriented to person, place, and time.     Comments: CN II-XII are fully intact.  Her strength is 5/5 throughtout.  Psychiatric:        Mood and Affect: Mood normal.        Behavior: Behavior normal.        Assessment & Plan:   TIA (transient ischemic attack) I reviewed her CT's and MRI of her brain and this showed no evidence of stroke.  She may have had a TIA and I want her to restart ASA 81mg  daily.  We will have her take lovastatin 10mg  daily instead of twice per week.  She began having myalgias after starting lipitor and she was not sent home on lipitor.  We will also refer her to neurology.  PFO (patent foramen ovale) She needs a Transesophageal ECHO (TEE) and we will set her up to see cardiology to have this done to evaluate for possible PFO.  Myalgia Her WBC was slightly elevated at discharge.  She could be having a viral symptoms but we will check a CK level.  This could also be from her use of lipitor.  Inadequately controlled diabetes mellitus (HCC) She states her sugars are running 180-210.  I want her to increase her lantus to 50 Units daily and increase her coverage at meals to 12 Units of regular insulin TID and continue ozempic at 2mg  subcut weekly.    No follow-ups on file.   Crist Fat, MD

## 2023-05-19 NOTE — Assessment & Plan Note (Signed)
I reviewed her CT's and MRI of her brain and this showed no evidence of stroke.  She may have had a TIA and I want her to restart ASA 81mg  daily.  We will have her take lovastatin 10mg  daily instead of twice per week.  She began having myalgias after starting lipitor and she was not sent home on lipitor.  We will also refer her to neurology.

## 2023-05-19 NOTE — Assessment & Plan Note (Signed)
She states her sugars are running 180-210.  I want her to increase her lantus to 50 Units daily and increase her coverage at meals to 12 Units of regular insulin TID and continue ozempic at 2mg  subcut weekly.

## 2023-05-19 NOTE — Assessment & Plan Note (Signed)
She needs a Transesophageal ECHO (TEE) and we will set her up to see cardiology to have this done to evaluate for possible PFO.

## 2023-05-19 NOTE — Assessment & Plan Note (Signed)
Her WBC was slightly elevated at discharge.  She could be having a viral symptoms but we will check a CK level.  This could also be from her use of lipitor.

## 2023-05-20 ENCOUNTER — Encounter: Payer: Self-pay | Admitting: Cardiology

## 2023-05-20 ENCOUNTER — Ambulatory Visit: Payer: Federal, State, Local not specified - PPO | Attending: Cardiology | Admitting: Cardiology

## 2023-05-20 VITALS — BP 120/84 | HR 93 | Ht 64.5 in | Wt 180.2 lb

## 2023-05-20 DIAGNOSIS — R2990 Unspecified symptoms and signs involving the nervous system: Secondary | ICD-10-CM | POA: Diagnosis not present

## 2023-05-20 DIAGNOSIS — E669 Obesity, unspecified: Secondary | ICD-10-CM

## 2023-05-20 DIAGNOSIS — Q2112 Patent foramen ovale: Secondary | ICD-10-CM | POA: Diagnosis not present

## 2023-05-20 DIAGNOSIS — E782 Mixed hyperlipidemia: Secondary | ICD-10-CM

## 2023-05-20 DIAGNOSIS — N920 Excessive and frequent menstruation with regular cycle: Secondary | ICD-10-CM

## 2023-05-20 DIAGNOSIS — R000 Tachycardia, unspecified: Secondary | ICD-10-CM | POA: Diagnosis not present

## 2023-05-20 DIAGNOSIS — E1165 Type 2 diabetes mellitus with hyperglycemia: Secondary | ICD-10-CM | POA: Diagnosis not present

## 2023-05-20 HISTORY — DX: Excessive and frequent menstruation with regular cycle: N92.0

## 2023-05-20 HISTORY — DX: Obesity, unspecified: E66.9

## 2023-05-20 LAB — CBC WITH DIFFERENTIAL/PLATELET
Basophils Absolute: 0 10*3/uL (ref 0.0–0.2)
Basos: 0 %
EOS (ABSOLUTE): 0 10*3/uL (ref 0.0–0.4)
Eos: 0 %
Hematocrit: 36.9 % (ref 34.0–46.6)
Hemoglobin: 11.8 g/dL (ref 11.1–15.9)
Immature Grans (Abs): 0 10*3/uL (ref 0.0–0.1)
Immature Granulocytes: 0 %
Lymphocytes Absolute: 3 10*3/uL (ref 0.7–3.1)
Lymphs: 44 %
MCH: 28.7 pg (ref 26.6–33.0)
MCHC: 32 g/dL (ref 31.5–35.7)
MCV: 90 fL (ref 79–97)
Monocytes Absolute: 0.4 10*3/uL (ref 0.1–0.9)
Monocytes: 5 %
Neutrophils Absolute: 3.5 10*3/uL (ref 1.4–7.0)
Neutrophils: 51 %
Platelets: 280 10*3/uL (ref 150–450)
RBC: 4.11 x10E6/uL (ref 3.77–5.28)
RDW: 15.5 % — ABNORMAL HIGH (ref 11.7–15.4)
WBC: 7 10*3/uL (ref 3.4–10.8)

## 2023-05-20 LAB — CK: Total CK: 142 U/L (ref 32–182)

## 2023-05-20 MED ORDER — ASPIRIN 81 MG PO TBEC: 81.0000 mg | DELAYED_RELEASE_TABLET | Freq: Every day | ORAL | 3 refills | Status: AC

## 2023-05-20 NOTE — Progress Notes (Unsigned)
Initial neurology clinic note  Reason for Evaluation: Consultation requested by Crist Fat, MD for an opinion regarding transient left arm pain and weakness. My final recommendations will be communicated back to the requesting physician by way of shared medical record or letter to requesting physician via [place] mail.  HPI: This is [name], a 42 y.o. right-handed female with a medical history of DM, asthma, anxiety, GERD who presents to neurology clinic with the chief complaint of transient left arm pain and weakness. The patient is alone today.  On 05/16/23, patient had a sudden onset pain and heaviness and tingling sensation in left arm. She could not move her arm either. She was sent across the street to the ED. CTH showed no acute process. CTA head and neck showed no LVO or significant stenosis. MRI brain (under general anesthesia) on 05/17/23 that showed no acute abnormality. Echo showed normal LV function and possible PFO. Patient is now seeing cardiology (05/20/23). Per patient, cardiology thinks symptoms are related to this and not neurologic. She is scheduled to get a TEE soon.   She is back to normal now. Her heaviness and pain lasted about 45 minutes. The tingling lasted about 24 hours. Her weakness improved later that night.  While in the hospital, patient was started on aspirin 81 mg and Plavix 75 mg daily, with a plan for 21 days. She only took plavix for 3 days though because PCP and cardiologist did not think this was needed. She is also on lovastatin 10 mg daily. She is taking aspirin 81 mg daily.  She has had no further symptoms since that day. She does have some muscle aches all over from taking Lipitor. This is improving.  The patient has not had similar episodes of symptoms in the past.    She report any constitutional symptoms like fever, night sweats, anorexia or unintentional weight loss.  EtOH use: 1-2 drinks, 1-2 times per month Non-smoker  Restrictive  diet? No Family history of neuropathy/myopathy/neurologic disease? Mother with epilepsy, grandmother with neuropathy, mother also has diabetic neuropathy   MEDICATIONS:  Outpatient Encounter Medications as of 05/22/2023  Medication Sig   AIRSUPRA 90-80 MCG/ACT AERO Inhale 2 puffs into the lungs as needed (every 6 hours for cough, wheeze, shortness of breath.  Rinse, gargle, and spit after use).   ALPRAZolam (XANAX) 0.25 MG tablet Take 1 tablet (0.25 mg total) by mouth at bedtime as needed for anxiety.   Ascorbic Acid (VITAMIN C) 1000 MG tablet Take 1,000 mg by mouth daily.   aspirin EC 81 MG tablet Take 1 tablet (81 mg total) by mouth daily. Swallow whole.   cetirizine (ZYRTEC) 10 MG tablet Take 10 mg by mouth daily.   EPINEPHrine (EPIPEN 2-PAK) 0.3 mg/0.3 mL IJ SOAJ injection Use as directed for life-threatening allergic reaction.   famotidine (PEPCID) 40 MG tablet Take one tablet by mouth every night. (Patient taking differently: Take 40 mg by mouth at bedtime. Take one tablet by mouth every night.)   Ferric Maltol (ACCRUFER) 30 MG CAPS Take 1 capsule (30 mg total) by mouth in the morning and at bedtime.   Insulin Pen Needle (BD PEN NEEDLE NANO 2ND GEN) 32G X 4 MM MISC 1 each by Other route daily.   LEVEMIR FLEXPEN 100 UNIT/ML FlexPen Inject 50 Units into the skin.   lovastatin (MEVACOR) 10 MG tablet Take 1 tablet (10 mg total) by mouth at bedtime. Take one tab po on Mon and thur   Multiple  Vitamin (MULTIVITAMIN WITH MINERALS) TABS tablet Take 1 tablet by mouth daily.   Olopatadine-Mometasone (RYALTRIS) X543819 MCG/ACT SUSP Use two sprays in each nostril one to two times per day as directed. (Patient taking differently: Place 2 sprays into the nose 2 (two) times daily. Use two sprays in each nostril one to two times per day as directed.)   omeprazole (PRILOSEC) 40 MG capsule Take 1 capsule (40 mg total) by mouth in the morning.   Semaglutide, 2 MG/DOSE, (OZEMPIC, 2 MG/DOSE,) 8 MG/3ML SOPN  INJECT 2 MG INTO THE SKIN ONCE A WEEK.   triamcinolone cream (KENALOG) 0.1 % Apply 1 Application topically 2 (two) times daily.   Vitamin D, Cholecalciferol, 25 MCG (1000 UT) TABS Take 1 tablet by mouth daily.   Zinc 50 MG TABS Take 1 tablet by mouth daily.   albuterol (VENTOLIN HFA) 108 (90 Base) MCG/ACT inhaler Inhale 1-2 puffs into the lungs every 6 (six) hours as needed for wheezing or shortness of breath. (Patient not taking: Reported on 05/22/2023)   cyclobenzaprine (FLEXERIL) 10 MG tablet Take 10 mg by mouth 3 (three) times daily as needed for muscle spasms. (Patient not taking: Reported on 05/22/2023)   No facility-administered encounter medications on file as of 05/22/2023.    PAST MEDICAL HISTORY: Past Medical History:  Diagnosis Date   Anemia    Anxiety    COVID-19 12/2020   Diabetes mellitus without complication (HCC)    Family history of adverse reaction to anesthesia    unknown reaction (mother) when waking up after kidney transplant   GERD (gastroesophageal reflux disease)     PAST SURGICAL HISTORY: Past Surgical History:  Procedure Laterality Date   CHOLECYSTECTOMY     ESOPHAGOGASTRODUODENOSCOPY     x3   GYNECOLOGIC CRYOSURGERY     PAROTIDECTOMY Left 02/13/2022   Procedure: PAROTIDECTOMY;  Surgeon: Vernie Murders, MD;  Location: [facility] ORS;  Service: ENT;  Laterality: Left;   SUPERFICIAL LYMPH NODE BIOPSY / EXCISION      ALLERGIES: Allergies  Allergen Reactions   Bee Venom Anaphylaxis   Peanut-Containing Drug Products Anaphylaxis, Other (See Comments) and Swelling    Pt states she felt like something was in her throat after eating peanuts.  Peanuts AND MACADAMIA NUTS; PT STATES IT FEELS LIKE SHE HAS A KNOT IN HER THROAT   Pineapple Anaphylaxis and Hives   Latex Hives, Itching, Swelling and Other (See Comments)   Tramadol Other (See Comments)   Macadamia Nut Oil Other (See Comments)    Test positive on allergen test    Trazodone Other (See Comments)     FAMILY HISTORY: Family History  Problem Relation Age of Onset   Diabetes Mother    High Cholesterol Mother    Heart disease Mother    Epilepsy Mother    High Cholesterol Father    Diabetes Father    Hypertension Father    Throat cancer Maternal Grandfather     SOCIAL HISTORY: Social History   Tobacco Use   Smoking status: Never   Smokeless tobacco: Never  Vaping Use   Vaping status: Never Used  Substance Use Topics   Alcohol use: Yes    Comment: occas   Drug use: Never   Social History   Social History Narrative   Are you right handed or left handed? Right   Are you currently employed ?    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  What type of home do you live in: 1 story or 2 story? one    Caffiene 1 cup daily     OBJECTIVE: PHYSICAL EXAM: BP 127/80   Pulse 97   Ht 5' 4.5" (1.638 m)   Wt 180 lb (81.6 kg)   SpO2 99%   BMI 30.42 kg/m   General: General appearance: Awake and alert. No distress. Cooperative with exam.  Skin: No obvious rash or jaundice. HEENT: Atraumatic. Anicteric. Lungs: Non-labored breathing on room air  Heart: Regular. No carotid bruits. Abdomen: Soft, non tender. Extremities: No edema. No obvious deformity.  Musculoskeletal: No obvious joint swelling. Psych: Affect appropriate.  Neurological: Mental Status: Alert. Speech fluent. No pseudobulbar affect Cranial Nerves: CNII: No RAPD. Visual fields grossly intact. CNIII, IV, VI: PERRL. No nystagmus. EOMI. CN V: Facial sensation intact bilaterally to fine touch. [place] clench strong. Left V2 is numb due to prior surgery for gland removal (02/2022) CN VII: Facial muscles symmetric and strong. No ptosis at rest. CN VIII: Hearing grossly intact bilaterally. CN IX: No hypophonia. CN X: Palate elevates symmetrically. CN XI: Full strength shoulder shrug bilaterally. CN XII: Tongue protrusion full and midline. No atrophy or  fasciculations. No significant dysarthria Motor: Tone is normal.  Individual muscle group testing (MRC grade out of 5):  Movement     Neck flexion 5    Neck extension 5     Right Left   Shoulder abduction 5 5   Shoulder adduction 5 5   Shoulder ext rotation 5 5   Shoulder int rotation 5 5   Elbow flexion 5 5   Elbow extension 5 5   Wrist extension 5 5   Wrist flexion 5 5   Finger abduction - FDI 5 5   Finger abduction - ADM 5 5   Finger extension 5 5   Finger distal flexion - 2/3 5 5    Finger distal flexion - 4/5 5 5    Thumb flexion - FPL 5 5   Thumb abduction - APB 5 5    Hip flexion 5 5   Hip extension 5 5   Hip adduction 5 5   Hip abduction 5 5   Knee extension 5 5   Knee flexion 5 5   Dorsiflexion 5 5   Plantarflexion 5 5    Reflexes:  Right Left   Bicep 2+ 2+   Tricep 2+ 2+   BrRad 2+ 2+   Knee 2+ 2+   Ankle 2+ 2+    Sensation: Pinprick: Intact in all extremities Coordination: Intact finger-to- nose-finger bilaterally. Romberg negative. Gait: Able to rise from chair with arms crossed unassisted. Normal, narrow-based gait.  Lab and Test Review: Internal labs: 05/19/23: CK: 142  Celiac panel (05/05/23): negative  External labs: 05/18/23: CBC unremarkable BMP unremarkable  Lipid panel (05/17/23): tChol 151, LDL 75, TG 88 HbA1c (05/16/23): 8.6  External Imaging: MRI Head WO Contrast (05/17/23): No stroke or concerning pathology per my read.  Per radiology: Negative for acute infarct.  No acute intracranial hemorrhage identified.  No midline shift or acute mass effect.  No abnormal extra-axial fluid collection identified.  Ventricles, cisterns, and sulci are unremarkable.  Negative for hydrocephalus.  Major basilar intracranial flow voids are patent.  Orbits appear unremarkable.  Paranasal sinuses are clear.  Mild left mastoid effusion.  No suspicious osseous lesions identified.  IMPRESSION:  No acute intracranial abnormality identified.    CTA head and neck (05/16/23): No LVO or significant stenosis per my  read. Per radiology: CT Code Stroke Head Neck Angio  Final Result  IMPRESSION:  1. No occlusion or significant stenosis.   Echocardiogram (05/16/23): Echocardiogram Complete WO Enhancing Agent  Final Result  Left Ventricle: Left ventricle size is normal.  Left Ventricle: Systolic function is normal. EF: 60-65%. Quantitative  analysis of left ventricular Global Longitudinal Strain (GLS) imaging is  -22.700%. Ejection fraction measured by 3D is 60%,  Left Atrium: Injection of agitated saline documents few late bubbles on  left side suggestive of small PFO or pulmonary AVM.  Right Ventricle: Right ventricle size is normal. Systolic function is  normal.   MRI cervical spine (11/07/20): FINDINGS: Intermittently motion degraded examination. Most notably, there is mild motion degradation of the sagittal STIR sequence, and moderate motion degradation of the axial T2 TSE sequence.   Alignment: Reversal of the expected cervical lordosis. No significant spondylolisthesis.   Vertebrae: Vertebral body height is maintained. No significant marrow edema or focal suspicious osseous lesion.   Cord: Within the limitations of motion degradation, no spinal cord signal abnormality is identified.   Posterior Fossa, vertebral arteries, paraspinal tissues: No abnormality identified within included portions of the posterior fossa. Flow voids preserved within the imaged cervical vertebral arteries. Paraspinal soft tissues within normal limits.   Disc levels:   No more than mild disc degeneration at any level.   C2-C3: No significant disc herniation or stenosis.   C3-C4: No significant disc herniation or stenosis.   C4-C5: Small central disc protrusion. Mild partial effacement of the ventral thecal sac with possible contact upon the ventral spinal cord. No significant foraminal stenosis.   C5-C6: Small central disc  protrusion. Mild partial effacement of the ventral thecal sac with possible contact upon the ventral spinal cord. No significant foraminal stenosis.   C6-C7: Small central disc protrusion. Mild partial effacement of the ventral thecal sac with possible contact upon the ventral spinal cord. No significant foraminal stenosis.   C7-T1: No significant disc herniation or stenosis.   IMPRESSION: Motion degraded examination.   Cervical spondylosis, as outlined and with findings most notably as follows.   There are small central disc protrusions at C4-C5, C5-C6 and C6-C7 with resultant mild relative spinal canal narrowing and possible contact upon the ventral spinal cord.   No significant foraminal stenosis.   No more than mild disc degeneration within the cervical spine.  ASSESSMENT: Nancy Nunez is a 42 y.o. female who presents for evaluation of transient left arm pain and weakness resolving completely in about 24 hours. She has a relevant medical history of DM, asthma, anxiety, GERD. Her neurological examination is normal today. Available diagnostic data is significant for MRI brain showing no stroke, CTA head and neck showing no occlusion or significant stenosis, and echocardiogram normal except possible PFO. The etiology of patient's symptoms is unclear, but the most concerning possibility is TIA. While pain in unusual in TIA, her symptoms did seem to resolve in less than 24 hours with a normal MRI brain, which meets criteria. There does not appear to be a peripheral source of symptoms, nor would this be expected to resolve so quickly. After discussion, we agreed to treat as TIA as the safest course of action.  PLAN: -Blood work: TSH -Continue aspirin 81 mg daily -Continue lovastatin 10 mg daily -Stroke precautions discusssed -Follow up with cardiology as planned  -Return to clinic in 6 months  The impression above as well as the plan as outlined below were extensively discussed  with the patient who  voiced understanding. All questions were answered to their satisfaction.  When available, results of the above investigations and possible further recommendations will be communicated to the patient via telephone/MyChart. Patient to call office if not contacted after expected testing turnaround time.   Thank you for allowing me to participate in patient's care.  If I can answer any additional questions, I would be pleased to do so.  Jacquelyne Balint, MD   CC: Crist Fat, MD [address] [place] [address]  CC: Referring provider: Crist Fat, MD [address] [place],  [place] [address]

## 2023-05-20 NOTE — H&P (View-Only) (Signed)
 Cardiology Office Note:    Date:  05/20/2023   ID:  Nancy Nunez, DOB May 25, 1981, MRN [id number]  PCP:  Nancy Fat, MD  Cardiologist:  Nancy Herrlich, MD   Referring MD: Nancy Nunez, Nancy Cower, MD  ASSESSMENT:    1. PFO (patent foramen ovale)   2. Tachycardia   3. Stroke-like episode   4. Type 2 diabetes mellitus with hyperglycemia, unspecified whether long term insulin use (HCC)   5. Mixed hyperlipidemia    PLAN:    In order of problems listed above:  Recently admitted to the hospital strokelike syndrome found to have evidence of PFO I think she would benefit from having a TEE performed we will schedule at [facility] and she will continue her current medications in the room she tells me she is taking aspirin and Plavix and will confirm this before she leaves the office She needs to be off her semaglutide for 1 week before TEE Stable diabetes managed by her PCP Having muscle symptoms stop atorvastatin and go back to her low-dose lovastatin 2 days a week with an LDL of 75 After COVID she had tachycardia that has resolved no documented atrial fibrillation  Next appointment 3 months   Medication Adjustments/Labs and Tests Ordered: Current medicines are reviewed at length with the patient today.  Concerns regarding medicines are outlined above.  Orders Placed This Encounter  Procedures   EKG 12-Lead   No orders of the defined types were placed in this encounter.    Chief Complaint  Patient presents with   PFO    History of Present Illness:    Nancy Nunez is a 42 y.o. female who is being seen today for the evaluation of PFO in the context of strokelike symptoms at the request of Nancy Fat, MD. He was recently hospitalized at [facility] 05/16/2023 with a discharge diagnosis of stroke no evidence of extracranial obstructive arterial disease no abnormality on MRI echocardiogram that showed possible PFO or pulmonary AV malformation. Her echocardiogram was  05/16/2023 there is a notation that she had contrast saline ejection performed what is described as few or scant bubbles indicative of small PFO or pulmonary AVM the timing is not designated earlier late the right and left heart are normal in structure. Review of the chart shows that she had a normal MRI of the brain lipid profile showed an LDL 75 she was described as having strokelike symptoms treated with aspirin and statin.  CT brain was also normal.  CTA head neck showed no stenotic cerebrovascular disease her EKG was described as sinus rhythm cannot rule out anterior MI.  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 Recently has had reflux was placed on PPI as well as Pepcid is improved She has no history of esophageal stricture or dysphagia She received general anesthesia and was intubated for MRI She has had previous upper endoscopies performed She was transitioned to atorvastatin is having severe muscle pain should go back to her previous lovastatin She is not having chest pain edema shortness of breath palpitation or syncope and has had no recurrent strokelike symptoms  Past Medical History:  Diagnosis Date   Anemia    Anxiety    COVID-19 12/2020   Diabetes mellitus without complication (HCC)    Family history of adverse reaction to anesthesia    unknown reaction (mother) when waking up after kidney transplant  GERD (gastroesophageal reflux disease)     Past Surgical History:  Procedure Laterality Date   CHOLECYSTECTOMY     ESOPHAGOGASTRODUODENOSCOPY     x3   GYNECOLOGIC CRYOSURGERY     PAROTIDECTOMY Left 02/13/2022   Procedure: PAROTIDECTOMY;  Surgeon: Nancy Murders, MD;  Location: [facility] ORS;  Service: ENT;  Laterality: Left;   SUPERFICIAL LYMPH NODE BIOPSY / EXCISION      Current Medications: Current Meds  Medication Sig   AIRSUPRA 90-80  MCG/ACT AERO Inhale 2 puffs into the lungs as needed (every 6 hours for cough, wheeze, shortness of breath.  Rinse, gargle, and spit after use).   albuterol (VENTOLIN HFA) 108 (90 Base) MCG/ACT inhaler Inhale 1-2 puffs into the lungs every 6 (six) hours as needed for wheezing or shortness of breath.   ALPRAZolam (XANAX) 0.25 MG tablet Take 1 tablet (0.25 mg total) by mouth at bedtime as needed for anxiety.   Ascorbic Acid (VITAMIN C) 1000 MG tablet Take 1,000 mg by mouth daily.   cetirizine (ZYRTEC) 10 MG tablet Take 10 mg by mouth daily.   cyclobenzaprine (FLEXERIL) 10 MG tablet Take 10 mg by mouth 3 (three) times daily as needed for muscle spasms.   EPINEPHrine (EPIPEN 2-PAK) 0.3 mg/0.3 mL IJ SOAJ injection Use as directed for life-threatening allergic reaction. (Patient taking differently: Inject 0.3 mg into the muscle as needed for anaphylaxis. Use as directed for life-threatening allergic reaction.)   famotidine (PEPCID) 40 MG tablet Take one tablet by mouth every night. (Patient taking differently: Take 40 mg by mouth at bedtime. Take one tablet by mouth every night.)   Ferric Maltol (ACCRUFER) 30 MG CAPS Take 1 capsule (30 mg total) by mouth in the morning and at bedtime.   Insulin Pen Needle (BD PEN NEEDLE NANO 2ND GEN) 32G X 4 MM MISC 1 each by Other route daily.   LEVEMIR FLEXPEN 100 UNIT/ML FlexPen Inject into the skin.   lovastatin (MEVACOR) 10 MG tablet Take 1 tablet (10 mg total) by mouth at bedtime. Take one tab po on Mon and thur   Multiple Vitamin (MULTIVITAMIN WITH MINERALS) TABS tablet Take 1 tablet by mouth daily.   Olopatadine-Mometasone (RYALTRIS) X543819 MCG/ACT SUSP Use two sprays in each nostril one to two times per day as directed. (Patient taking differently: Place 2 sprays into the nose 2 (two) times daily. Use two sprays in each nostril one to two times per day as directed.)   omeprazole (PRILOSEC) 40 MG capsule Take 1 capsule (40 mg total) by mouth in the morning.    Semaglutide, 2 MG/DOSE, (OZEMPIC, 2 MG/DOSE,) 8 MG/3ML SOPN INJECT 2 MG INTO THE SKIN ONCE A WEEK.   triamcinolone cream (KENALOG) 0.1 % Apply 1 Application topically 2 (two) times daily.   Vitamin D, Cholecalciferol, 25 MCG (1000 UT) TABS Take 1 tablet by mouth daily.   Zinc 50 MG TABS Take 1 tablet by mouth daily.     Allergies:   Bee venom, Peanut-containing drug products, Pineapple, Latex, Tramadol, Macadamia nut oil, and Trazodone   Social History   Socioeconomic History   Marital status: Single    Spouse name: Not on file   Number of children: Not on file   Years of education: Not on file   Highest education level: Not on file  Occupational History   Not on file  Tobacco Use   Smoking status: Never   Smokeless tobacco: Never  Vaping Use   Vaping status: Never Used  Substance and Sexual  Activity   Alcohol use: Yes   Drug use: Never   Sexual activity: Not on file  Other Topics Concern   Not on file  Social History Narrative   Not on file   Social Determinants of Health   Financial Resource Strain: Not on file  Food Insecurity: Not on file  Transportation Needs: No Transportation Needs (05/18/2023)   Received from [facility] - Transportation    Lack of Transportation (Medical): No    Lack of Transportation (Non-Medical): No  Physical Activity: Not on file  Stress: Not on file  Social Connections: Not on file     Family History: The patient's family history includes Diabetes in her father and mother; Epilepsy in her mother; Heart disease in her mother; High Cholesterol in her father and mother; Hypertension in her father; Throat cancer in her maternal grandfather.  ROS:   ROS Please see the history of present illness.     All other systems reviewed and are negative.  EKGs/Labs/Other Studies Reviewed:    The following studies were reviewed today:  EKG Interpretation Date/Time:  Tuesday May 20 2023 14:50:46 EST Ventricular Rate:  93 PR  Interval:  142 QRS Duration:  66 QT Interval:  358 QTC Calculation: 445 R Axis:   28  Text Interpretation: Normal sinus rhythm Nonspecific T wave abnormality Confirmed by Nancy Nunez ([id number]) on 05/20/2023 2:56:27 PM    Physical Exam:    VS:  BP 120/84 (BP Location: Right Arm, Patient Position: Sitting)   Pulse 93   Ht 5' 4.5" (1.638 m)   Wt 180 lb 3.2 oz (81.7 kg)   SpO2 96%   BMI 30.45 kg/m     Wt Readings from Last 3 Encounters:  05/20/23 180 lb 3.2 oz (81.7 kg)  05/19/23 178 lb 12.8 oz (81.1 kg)  05/05/23 178 lb (80.7 kg)     GEN:  Well nourished, well developed in no acute distress HEENT: Normal NECK: No JVD; No carotid bruits LYMPHATICS: No lymphadenopathy CARDIAC: RRR, no murmurs, rubs, gallops RESPIRATORY:  Clear to auscultation without rales, wheezing or rhonchi  ABDOMEN: Soft, non-tender, non-distended MUSCULOSKELETAL:  No edema; No deformity  SKIN: Warm and dry NEUROLOGIC:  Alert and oriented x 3 PSYCHIATRIC:  Normal affect     Signed, Nancy Herrlich, MD  05/20/2023 3:28 PM    [facility]

## 2023-05-20 NOTE — Patient Instructions (Signed)
Medication Instructions:  Your physician recommends that you continue on your current medications as directed. Please refer to the Current Medication list given to you today.  *If you need a refill on your cardiac medications before your next appointment, please call your pharmacy*   Lab Work: Your physician recommends that you return for lab work in:   Labs today: BMP, CBC  If you have labs (blood work) drawn today and your tests are completely normal, you will receive your results only by: MyChart Message (if you have MyChart) OR A paper copy in the mail If you have any lab test that is abnormal or we need to change your treatment, we will call you to review the results.   Testing/Procedures:  Dear Nancy Nunez  You are scheduled for a TEE (Transesophageal Echocardiogram) on Monday, December 23 with Dr. Anne Fu.  Please arrive at the [facility] (Main Entrance A) at [facility]: [address] [place], [place] [address] at 9:00 AM (This time is 1.5 hour(s) before your procedure to ensure your preparation).   Free valet parking service is available. You will check in at ADMITTING.   *Please Note: You will receive a call the day before your procedure to confirm the appointment time. That time may have changed from the original time based on the schedule for that day.*   DIET:  Nothing to eat or drink after midnight except a sip of water with medications (see medication instructions below)  MEDICATION INSTRUCTIONS: !!IF ANY NEW MEDICATIONS ARE STARTED AFTER TODAY, PLEASE NOTIFY YOUR PROVIDER AS SOON AS POSSIBLE!!  FYI: Medications such as Semaglutide (Ozempic, [place]), Tirzepatide (Mounjaro, Zepbound), Dulaglutide (Trulicity), etc ("GLP1 agonists") AND Canagliflozin (Invokana), Dapagliflozin (Farxiga), Empagliflozin (Jardiance), Ertugliflozin (Steglatro), Bexagliflozin [employer]) or any combination with one of these drugs such as Invokamet (Canagliflozin/Metformin), Synjardy  (Empagliflozin/Metformin), etc ("SGLT2 inhibitors") must be held around the time of a procedure. This is not a comprehensive list of all of these drugs. Please review all of your medications and talk to your provider if you take any one of these. If you are not sure, ask your provider.   HOLD: Semaglutide (Ozempic, Rybelsus, Wegovy) for 1 week prior to the procedure. Last dose on Monday, December 15.  HOLD all diabetic medications the morning of the procedure.  LABS: BMP and CBC  FYI:  For your safety, and to allow [place] to monitor your vital signs accurately during the surgery/procedure we request: If you have artificial nails, gel coating, SNS etc, please have those removed prior to your surgery/procedure. Not having the nail coverings /polish removed may result in cancellation or delay of your surgery/procedure.  Your support person will be asked to wait in the waiting room during your procedure.  It is OK to have someone drop you off and come back when you are ready to be discharged.  You cannot drive after the procedure and will need someone to drive you home.  Bring your insurance cards.  *Special Note: Every effort is made to have your procedure done on time. Occasionally there are emergencies that occur at the hospital that may cause delays. Please be patient if a delay does occur.   Follow-Up: At [facility], you and your health needs are our priority.  As part of our continuing mission to provide you with exceptional heart care, we have created designated Provider Care Teams.  These Care Teams include your primary Cardiologist (physician) and Advanced Practice Providers (APPs -  Physician Assistants and Nurse Practitioners)  who all work together to provide you with the care you need, when you need it.  We recommend signing up for the patient portal called "MyChart".  Sign up information is provided on this After Visit Summary.  MyChart is used to connect with patients for  Virtual Visits (Telemedicine).  Patients are able to view lab/test results, encounter notes, upcoming appointments, etc.  Non-urgent messages can be sent to your provider as well.   To learn more about what you can do with MyChart, go to [email or web address].    Your next appointment:   6 week(s)  Provider:   Norman Herrlich, MD    Other Instructions None

## 2023-05-20 NOTE — Progress Notes (Signed)
Cardiology Office Note:    Date:  05/20/2023   ID:  Nancy Nunez, DOB May 25, 1981, MRN [id number]  PCP:  Crist Fat, MD  Cardiologist:  Norman Herrlich, MD   Referring MD: Leonia Reader, Barbara Cower, MD  ASSESSMENT:    1. PFO (patent foramen ovale)   2. Tachycardia   3. Stroke-like episode   4. Type 2 diabetes mellitus with hyperglycemia, unspecified whether long term insulin use (HCC)   5. Mixed hyperlipidemia    PLAN:    In order of problems listed above:  Recently admitted to the hospital strokelike syndrome found to have evidence of PFO I think she would benefit from having a TEE performed we will schedule at [facility] and she will continue her current medications in the room she tells me she is taking aspirin and Plavix and will confirm this before she leaves the office She needs to be off her semaglutide for 1 week before TEE Stable diabetes managed by her PCP Having muscle symptoms stop atorvastatin and go back to her low-dose lovastatin 2 days a week with an LDL of 75 After COVID she had tachycardia that has resolved no documented atrial fibrillation  Next appointment 3 months   Medication Adjustments/Labs and Tests Ordered: Current medicines are reviewed at length with the patient today.  Concerns regarding medicines are outlined above.  Orders Placed This Encounter  Procedures   EKG 12-Lead   No orders of the defined types were placed in this encounter.    Chief Complaint  Patient presents with   PFO    History of Present Illness:    Nancy Nunez is a 42 y.o. female who is being seen today for the evaluation of PFO in the context of strokelike symptoms at the request of Crist Fat, MD. He was recently hospitalized at [facility] 05/16/2023 with a discharge diagnosis of stroke no evidence of extracranial obstructive arterial disease no abnormality on MRI echocardiogram that showed possible PFO or pulmonary AV malformation. Her echocardiogram was  05/16/2023 there is a notation that she had contrast saline ejection performed what is described as few or scant bubbles indicative of small PFO or pulmonary AVM the timing is not designated earlier late the right and left heart are normal in structure. Review of the chart shows that she had a normal MRI of the brain lipid profile showed an LDL 75 she was described as having strokelike symptoms treated with aspirin and statin.  CT brain was also normal.  CTA head neck showed no stenotic cerebrovascular disease her EKG was described as sinus rhythm cannot rule out anterior MI.  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 Recently has had reflux was placed on PPI as well as Pepcid is improved She has no history of esophageal stricture or dysphagia She received general anesthesia and was intubated for MRI She has had previous upper endoscopies performed She was transitioned to atorvastatin is having severe muscle pain should go back to her previous lovastatin She is not having chest pain edema shortness of breath palpitation or syncope and has had no recurrent strokelike symptoms  Past Medical History:  Diagnosis Date   Anemia    Anxiety    COVID-19 12/2020   Diabetes mellitus without complication (HCC)    Family history of adverse reaction to anesthesia    unknown reaction (mother) when waking up after kidney transplant  GERD (gastroesophageal reflux disease)     Past Surgical History:  Procedure Laterality Date   CHOLECYSTECTOMY     ESOPHAGOGASTRODUODENOSCOPY     x3   GYNECOLOGIC CRYOSURGERY     PAROTIDECTOMY Left 02/13/2022   Procedure: PAROTIDECTOMY;  Surgeon: Vernie Murders, MD;  Location: [facility] ORS;  Service: ENT;  Laterality: Left;   SUPERFICIAL LYMPH NODE BIOPSY / EXCISION      Current Medications: Current Meds  Medication Sig   AIRSUPRA 90-80  MCG/ACT AERO Inhale 2 puffs into the lungs as needed (every 6 hours for cough, wheeze, shortness of breath.  Rinse, gargle, and spit after use).   albuterol (VENTOLIN HFA) 108 (90 Base) MCG/ACT inhaler Inhale 1-2 puffs into the lungs every 6 (six) hours as needed for wheezing or shortness of breath.   ALPRAZolam (XANAX) 0.25 MG tablet Take 1 tablet (0.25 mg total) by mouth at bedtime as needed for anxiety.   Ascorbic Acid (VITAMIN C) 1000 MG tablet Take 1,000 mg by mouth daily.   cetirizine (ZYRTEC) 10 MG tablet Take 10 mg by mouth daily.   cyclobenzaprine (FLEXERIL) 10 MG tablet Take 10 mg by mouth 3 (three) times daily as needed for muscle spasms.   EPINEPHrine (EPIPEN 2-PAK) 0.3 mg/0.3 mL IJ SOAJ injection Use as directed for life-threatening allergic reaction. (Patient taking differently: Inject 0.3 mg into the muscle as needed for anaphylaxis. Use as directed for life-threatening allergic reaction.)   famotidine (PEPCID) 40 MG tablet Take one tablet by mouth every night. (Patient taking differently: Take 40 mg by mouth at bedtime. Take one tablet by mouth every night.)   Ferric Maltol (ACCRUFER) 30 MG CAPS Take 1 capsule (30 mg total) by mouth in the morning and at bedtime.   Insulin Pen Needle (BD PEN NEEDLE NANO 2ND GEN) 32G X 4 MM MISC 1 each by Other route daily.   LEVEMIR FLEXPEN 100 UNIT/ML FlexPen Inject into the skin.   lovastatin (MEVACOR) 10 MG tablet Take 1 tablet (10 mg total) by mouth at bedtime. Take one tab po on Mon and thur   Multiple Vitamin (MULTIVITAMIN WITH MINERALS) TABS tablet Take 1 tablet by mouth daily.   Olopatadine-Mometasone (RYALTRIS) [id number] MCG/ACT SUSP Use two sprays in each nostril one to two times per day as directed. (Patient taking differently: Place 2 sprays into the nose 2 (two) times daily. Use two sprays in each nostril one to two times per day as directed.)   omeprazole (PRILOSEC) 40 MG capsule Take 1 capsule (40 mg total) by mouth in the morning.    Semaglutide, 2 MG/DOSE, (OZEMPIC, 2 MG/DOSE,) 8 MG/3ML SOPN INJECT 2 MG INTO THE SKIN ONCE A WEEK.   triamcinolone cream (KENALOG) 0.1 % Apply 1 Application topically 2 (two) times daily.   Vitamin D, Cholecalciferol, 25 MCG (1000 UT) TABS Take 1 tablet by mouth daily.   Zinc 50 MG TABS Take 1 tablet by mouth daily.     Allergies:   Bee venom, Peanut-containing drug products, Pineapple, Latex, Tramadol, Macadamia nut oil, and Trazodone   Social History   Socioeconomic History   Marital status: Single    Spouse name: Not on file   Number of children: Not on file   Years of education: Not on file   Highest education level: Not on file  Occupational History   Not on file  Tobacco Use   Smoking status: Never   Smokeless tobacco: Never  Vaping Use   Vaping status: Never Used  Substance and Sexual  Activity   Alcohol use: Yes   Drug use: Never   Sexual activity: Not on file  Other Topics Concern   Not on file  Social History Narrative   Not on file   Social Determinants of Health   Financial Resource Strain: Not on file  Food Insecurity: Not on file  Transportation Needs: No Transportation Needs (05/18/2023)   Received from [facility] - Transportation    Lack of Transportation (Medical): No    Lack of Transportation (Non-Medical): No  Physical Activity: Not on file  Stress: Not on file  Social Connections: Not on file     Family History: The patient's family history includes Diabetes in her father and mother; Epilepsy in her mother; Heart disease in her mother; High Cholesterol in her father and mother; Hypertension in her father; Throat cancer in her maternal grandfather.  ROS:   ROS Please see the history of present illness.     All other systems reviewed and are negative.  EKGs/Labs/Other Studies Reviewed:    The following studies were reviewed today:  EKG Interpretation Date/Time:  Tuesday May 20 2023 14:50:46 EST Ventricular Rate:  93 PR  Interval:  142 QRS Duration:  66 QT Interval:  358 QTC Calculation: 445 R Axis:   28  Text Interpretation: Normal sinus rhythm Nonspecific T wave abnormality Confirmed by Norman Herrlich ([id number]) on 05/20/2023 2:56:27 PM    Physical Exam:    VS:  BP 120/84 (BP Location: Right Arm, Patient Position: Sitting)   Pulse 93   Ht 5' 4.5" (1.638 m)   Wt 180 lb 3.2 oz (81.7 kg)   SpO2 96%   BMI 30.45 kg/m     Wt Readings from Last 3 Encounters:  05/20/23 180 lb 3.2 oz (81.7 kg)  05/19/23 178 lb 12.8 oz (81.1 kg)  05/05/23 178 lb (80.7 kg)     GEN:  Well nourished, well developed in no acute distress HEENT: Normal NECK: No JVD; No carotid bruits LYMPHATICS: No lymphadenopathy CARDIAC: RRR, no murmurs, rubs, gallops RESPIRATORY:  Clear to auscultation without rales, wheezing or rhonchi  ABDOMEN: Soft, non-tender, non-distended MUSCULOSKELETAL:  No edema; No deformity  SKIN: Warm and dry NEUROLOGIC:  Alert and oriented x 3 PSYCHIATRIC:  Normal affect     Signed, Norman Herrlich, MD  05/20/2023 3:28 PM    [facility]

## 2023-05-21 LAB — BASIC METABOLIC PANEL
BUN/Creatinine Ratio: 11 (ref 9–23)
BUN: 8 mg/dL (ref 6–24)
CO2: 21 mmol/L (ref 20–29)
Calcium: 9.5 mg/dL (ref 8.7–10.2)
Chloride: 104 mmol/L (ref 96–106)
Creatinine, Ser: 0.75 mg/dL (ref 0.57–1.00)
Glucose: 216 mg/dL — ABNORMAL HIGH (ref 70–99)
Potassium: 4 mmol/L (ref 3.5–5.2)
Sodium: 141 mmol/L (ref 134–144)
eGFR: 101 mL/min/{1.73_m2} (ref >59)

## 2023-05-21 LAB — CBC
Hematocrit: 35.9 % (ref 34.0–46.6)
Hemoglobin: 12.1 g/dL (ref 11.1–15.9)
MCH: 29.3 pg (ref 26.6–33.0)
MCHC: 33.7 g/dL (ref 31.5–35.7)
MCV: 87 fL (ref 79–97)
Platelets: 291 10*3/uL (ref 150–450)
RBC: 4.13 x10E6/uL (ref 3.77–5.28)
RDW: 15.4 % (ref 11.7–15.4)
WBC: 6.8 10*3/uL (ref 3.4–10.8)

## 2023-05-22 ENCOUNTER — Other Ambulatory Visit: Payer: Federal, State, Local not specified - PPO

## 2023-05-22 ENCOUNTER — Encounter: Payer: Self-pay | Admitting: Neurology

## 2023-05-22 ENCOUNTER — Ambulatory Visit: Payer: Federal, State, Local not specified - PPO | Admitting: Neurology

## 2023-05-22 VITALS — BP 127/80 | HR 97 | Ht 64.5 in | Wt 180.0 lb

## 2023-05-22 DIAGNOSIS — R29818 Other symptoms and signs involving the nervous system: Secondary | ICD-10-CM | POA: Diagnosis not present

## 2023-05-22 DIAGNOSIS — M79602 Pain in left arm: Secondary | ICD-10-CM | POA: Diagnosis not present

## 2023-05-22 DIAGNOSIS — R29898 Other symptoms and signs involving the musculoskeletal system: Secondary | ICD-10-CM

## 2023-05-22 DIAGNOSIS — G459 Transient cerebral ischemic attack, unspecified: Secondary | ICD-10-CM

## 2023-05-22 DIAGNOSIS — M791 Myalgia, unspecified site: Secondary | ICD-10-CM | POA: Diagnosis not present

## 2023-05-22 NOTE — Patient Instructions (Addendum)
I saw you today for the episode of left arm pain, tingling, and weakness. I'm not sure the cause, but can't say for sure this wasn't a TIA. I would like you to continue the aspirin 81 mg daily in case this was TIA.   Follow up with cardiology as planned.  I will see you back in 6 months to reassess. Please let me know if you have any questions or concerns in the meantime.   If you have new difficulty speaking, face droop, numbness on one side of the body, weakness on one side of the body, or dizziness/imbalance, this could be the sign of a stroke. Don't wait, please call EMS and be evaluated at the nearest emergency room.   The physicians and staff at [facility] are committed to providing excellent care. You may receive a survey requesting feedback about your experience at our office. We strive to receive "very good" responses to the survey questions. If you feel that your experience would prevent you from giving the office a "very good " response, please contact our office to try to remedy the situation. We may be reached at [phone]. Thank you for taking the time out of your busy day to complete the survey.  Jacquelyne Balint, MD [facility]

## 2023-05-23 LAB — TSH: TSH: 0.8 m[IU]/L

## 2023-05-26 NOTE — Pre-Procedure Instructions (Signed)
Pt is scheduled for procedure 12/23 with anesthesia.  Anesthesia requires Ozempic to be held for 7 days before procedure.  She states her last dose was 12/12.  She is aware to not take it again until after procedure.

## 2023-05-27 NOTE — Progress Notes (Signed)
Her labs look good.  Pt aware of labs

## 2023-05-29 ENCOUNTER — Other Ambulatory Visit: Payer: Self-pay | Admitting: Internal Medicine

## 2023-05-30 NOTE — Progress Notes (Signed)
 Spoke to patient and instructed them to come at 0830  and to be NPO after 0000.  Medications reviewed.    Confirmed that patient will have a ride home and someone to stay with them for 24 hours after the procedure.

## 2023-06-01 NOTE — Anesthesia Preprocedure Evaluation (Signed)
Anesthesia Evaluation  Patient identified by MRN, date of birth, ID band Patient awake    Reviewed: Allergy & Precautions, H&P , NPO status , Patient's Chart, lab work & pertinent test results  History of Anesthesia Complications (+) Family history of anesthesia reaction  Airway Mallampati: I  TM Distance: >3 FB Neck ROM: Full    Dental no notable dental hx. (+) Teeth Intact, Dental Advisory Given   Pulmonary neg pulmonary ROS   Pulmonary exam normal breath sounds clear to auscultation       Cardiovascular negative cardio ROS Normal cardiovascular exam Rhythm:Regular Rate:Normal     Neuro/Psych   Anxiety     TIAnegative neurological ROS  negative psych ROS   GI/Hepatic negative GI ROS, Neg liver ROS,GERD  Medicated,,  Endo/Other  negative endocrine ROSdiabetes    Renal/GU negative Renal ROS  negative genitourinary   Musculoskeletal negative musculoskeletal ROS (+)    Abdominal   Peds negative pediatric ROS (+)  Hematology negative hematology ROS (+) Blood dyscrasia, anemia   Anesthesia Other Findings   Reproductive/Obstetrics negative OB ROS                             Anesthesia Physical Anesthesia Plan  ASA: 3  Anesthesia Plan: MAC   Post-op Pain Management: Minimal or no pain anticipated   Induction: Intravenous  PONV Risk Score and Plan: 2 and Propofol infusion  Airway Management Planned: Mask and Natural Airway  Additional Equipment: None  Intra-op Plan:   Post-operative Plan:   Informed Consent:      Dental advisory given  Plan Discussed with: Anesthesiologist and CRNA  Anesthesia Plan Comments:        Anesthesia Quick Evaluation

## 2023-06-02 ENCOUNTER — Ambulatory Visit (HOSPITAL_COMMUNITY): Payer: Self-pay | Admitting: Anesthesiology

## 2023-06-02 ENCOUNTER — Other Ambulatory Visit: Payer: Self-pay

## 2023-06-02 ENCOUNTER — Encounter (HOSPITAL_COMMUNITY)
Admission: RE | Disposition: A | Discharge status: Home / Self Care | Payer: Self-pay | Source: Home / Self Care | Attending: Cardiology

## 2023-06-02 ENCOUNTER — Ambulatory Visit (HOSPITAL_COMMUNITY)
Admission: RE | Admit: 2023-06-02 | Discharge: 2023-06-02 | Disposition: A | Discharge status: Home / Self Care | Payer: Federal, State, Local not specified - PPO | Attending: Cardiology | Admitting: Cardiology

## 2023-06-02 ENCOUNTER — Ambulatory Visit (HOSPITAL_BASED_OUTPATIENT_CLINIC_OR_DEPARTMENT_OTHER)
Admission: RE | Admit: 2023-06-02 | Discharge: 2023-06-02 | Disposition: A | Discharge status: Home / Self Care | Payer: Federal, State, Local not specified - PPO | Source: Ambulatory Visit | Attending: Cardiology | Admitting: Cardiology

## 2023-06-02 DIAGNOSIS — Z7902 Long term (current) use of antithrombotics/antiplatelets: Secondary | ICD-10-CM | POA: Diagnosis not present

## 2023-06-02 DIAGNOSIS — Q2112 Patent foramen ovale: Secondary | ICD-10-CM | POA: Diagnosis not present

## 2023-06-02 DIAGNOSIS — R2990 Unspecified symptoms and signs involving the nervous system: Secondary | ICD-10-CM

## 2023-06-02 DIAGNOSIS — Z7982 Long term (current) use of aspirin: Secondary | ICD-10-CM | POA: Insufficient documentation

## 2023-06-02 DIAGNOSIS — Z7985 Long-term (current) use of injectable non-insulin antidiabetic drugs: Secondary | ICD-10-CM | POA: Insufficient documentation

## 2023-06-02 DIAGNOSIS — Z79899 Other long term (current) drug therapy: Secondary | ICD-10-CM | POA: Diagnosis not present

## 2023-06-02 DIAGNOSIS — F419 Anxiety disorder, unspecified: Secondary | ICD-10-CM | POA: Diagnosis not present

## 2023-06-02 DIAGNOSIS — Z794 Long term (current) use of insulin: Secondary | ICD-10-CM | POA: Insufficient documentation

## 2023-06-02 DIAGNOSIS — E1165 Type 2 diabetes mellitus with hyperglycemia: Secondary | ICD-10-CM | POA: Diagnosis not present

## 2023-06-02 DIAGNOSIS — E782 Mixed hyperlipidemia: Secondary | ICD-10-CM | POA: Insufficient documentation

## 2023-06-02 DIAGNOSIS — I340 Nonrheumatic mitral (valve) insufficiency: Secondary | ICD-10-CM

## 2023-06-02 DIAGNOSIS — K219 Gastro-esophageal reflux disease without esophagitis: Secondary | ICD-10-CM | POA: Insufficient documentation

## 2023-06-02 DIAGNOSIS — Z8673 Personal history of transient ischemic attack (TIA), and cerebral infarction without residual deficits: Secondary | ICD-10-CM | POA: Diagnosis not present

## 2023-06-02 HISTORY — PX: TRANSESOPHAGEAL ECHOCARDIOGRAM (CATH LAB): EP1270

## 2023-06-02 LAB — ECHO TEE
AV Mean grad: 3 mm[Hg]
AV Peak grad: 6.8 mm[Hg]
Ao pk vel: 1.3 m/s

## 2023-06-02 LAB — GLUCOSE, CAPILLARY: Glucose-Capillary: 216 mg/dL — ABNORMAL HIGH (ref 70–99)

## 2023-06-02 SURGERY — TRANSESOPHAGEAL ECHOCARDIOGRAM (TEE) (CATHLAB)
Anesthesia: Monitor Anesthesia Care

## 2023-06-02 MED ORDER — SODIUM CHLORIDE 0.9% FLUSH
10.0000 mL | Freq: Two times a day (BID) | INTRAVENOUS | Status: DC
  Administered 2023-06-02: 10 mL via INTRAVENOUS

## 2023-06-02 MED ORDER — GLYCOPYRROLATE PF 0.2 MG/ML IJ SOSY
PREFILLED_SYRINGE | INTRAMUSCULAR | Status: DC | PRN
  Administered 2023-06-02: .2 mg via INTRAVENOUS

## 2023-06-02 MED ORDER — PROPOFOL 10 MG/ML IV BOLUS
INTRAVENOUS | Status: DC | PRN
  Administered 2023-06-02: 30 mg via INTRAVENOUS
  Administered 2023-06-02: 40 mg via INTRAVENOUS
  Administered 2023-06-02: 30 mg via INTRAVENOUS
  Administered 2023-06-02: 80 mg via INTRAVENOUS
  Administered 2023-06-02: 30 mg via INTRAVENOUS

## 2023-06-02 MED ORDER — LIDOCAINE 2% (20 MG/ML) 5 ML SYRINGE
INTRAMUSCULAR | Status: DC | PRN
  Administered 2023-06-02: 100 mg via INTRAVENOUS

## 2023-06-02 NOTE — Transfer of Care (Signed)
Immediate Anesthesia Transfer of Care Note  Patient: Nancy Nunez  Procedure(s) Performed: TRANSESOPHAGEAL ECHOCARDIOGRAM  Patient Location: PACU  Anesthesia Type:MAC  Level of Consciousness: drowsy  Airway & Oxygen Therapy: Patient Spontanous Breathing and Patient connected to face mask oxygen  Post-op Assessment: Report given to RN and Post -op Vital signs reviewed and stable  Post vital signs: Reviewed and stable  Last Vitals:  Vitals Value Taken Time  BP    Temp 36.4 C 06/02/23 1021  Pulse 88 06/02/23 1022  Resp 14 06/02/23 1022  SpO2 99 % 06/02/23 1022  Vitals shown include unfiled device data.  Last Pain:  Vitals:   06/02/23 1021  TempSrc: Temporal  PainSc: 0-No pain         Complications: No notable events documented.

## 2023-06-02 NOTE — Interval H&P Note (Signed)
History and Physical Interval Note:  06/02/2023 9:37 AM  Nancy Nunez  has presented today for surgery, with the diagnosis of PFO.  The various methods of treatment have been discussed with the patient and family. After consideration of risks, benefits and other options for treatment, the patient has consented to  Procedure(s): TRANSESOPHAGEAL ECHOCARDIOGRAM (N/A) as a surgical intervention.  The patient's history has been reviewed, patient examined, no change in status, stable for surgery.  I have reviewed the patient's chart and labs.  Questions were answered to the patient's satisfaction.     [employer]

## 2023-06-02 NOTE — CV Procedure (Signed)
   Transesophageal Echocardiogram  Indications: TIA  Time out performed  During this procedure the patient was administered propofol under anesthesiology supervision to achieve and maintain moderate sedation.  The patient's heart rate, blood pressure, and oxygen saturation are monitored continuously during the procedure.   Findings:  Left Ventricle: Ejection fraction 60%, normal  Mitral Valve: Mild mitral valve regurgitation  Aortic Valve: Trileaflet, normal  Tricuspid Valve: Mild tricuspid regurgitation  Left Atrium: Normal, no left atrial appendage thrombus  Right Atrium: Normal  Intraatrial septum: Normal  Bubble Contrast Study: Normal, no evidence of PFO  Donato Schultz, MD

## 2023-06-02 NOTE — Progress Notes (Signed)
  Echocardiogram Echocardiogram Transesophageal has been performed.  Nancy Nunez 06/02/2023, 10:44 AM

## 2023-06-02 NOTE — Anesthesia Postprocedure Evaluation (Signed)
Anesthesia Post Note  Patient: Nancy Nunez  Procedure(s) Performed: TRANSESOPHAGEAL ECHOCARDIOGRAM     Patient location during evaluation: PACU Anesthesia Type: MAC Level of consciousness: awake and alert Pain management: pain level controlled Vital Signs Assessment: post-procedure vital signs reviewed and stable Respiratory status: spontaneous breathing, nonlabored ventilation, respiratory function stable and patient connected to nasal cannula oxygen Cardiovascular status: stable and blood pressure returned to baseline Postop Assessment: no apparent nausea or vomiting Anesthetic complications: no   No notable events documented.  Last Vitals:  Vitals:   06/02/23 1025 06/02/23 1048  BP: (!) 98/54 125/79  Pulse: 94   Resp: 15   Temp:    SpO2: 100%     Last Pain:  Vitals:   06/02/23 1021  TempSrc: Temporal  PainSc: 0-No pain                 Albana Saperstein

## 2023-06-03 ENCOUNTER — Encounter (HOSPITAL_COMMUNITY): Payer: Self-pay | Admitting: Cardiology

## 2023-06-16 ENCOUNTER — Ambulatory Visit: Payer: Federal, State, Local not specified - PPO | Admitting: Allergy and Immunology

## 2023-06-16 DIAGNOSIS — J301 Allergic rhinitis due to pollen: Secondary | ICD-10-CM

## 2023-06-16 DIAGNOSIS — J3089 Other allergic rhinitis: Secondary | ICD-10-CM

## 2023-06-16 DIAGNOSIS — Z91018 Allergy to other foods: Secondary | ICD-10-CM

## 2023-06-17 NOTE — Progress Notes (Signed)
 Nancy Nunez returns to this clinic to have skin testing performed.  Allergy  skin testing identified hypersensitivity against dust mite, cat, dog, feathers, trees, grasses, weeds, Alternaria, Brazil nut.  Allergen avoidance measures were provided.  We will see her back in this clinic in 4 weeks.

## 2023-06-27 ENCOUNTER — Encounter: Payer: Self-pay | Admitting: Cardiology

## 2023-06-27 DIAGNOSIS — Z8489 Family history of other specified conditions: Secondary | ICD-10-CM | POA: Insufficient documentation

## 2023-06-27 DIAGNOSIS — K219 Gastro-esophageal reflux disease without esophagitis: Secondary | ICD-10-CM | POA: Insufficient documentation

## 2023-06-27 DIAGNOSIS — F419 Anxiety disorder, unspecified: Secondary | ICD-10-CM | POA: Insufficient documentation

## 2023-06-27 DIAGNOSIS — D649 Anemia, unspecified: Secondary | ICD-10-CM | POA: Insufficient documentation

## 2023-06-28 NOTE — Progress Notes (Unsigned)
Cardiology Office Note:    Date:  06/30/2023   ID:  Nancy Nunez, DOB September 30, 1980, MRN [id number]  PCP:  Crist Fat, MD  Cardiologist:  Norman Herrlich, MD    Referring MD: Crist Fat, MD    ASSESSMENT:    1. Stroke-like episode   2. Type 2 diabetes mellitus with hyperglycemia, unspecified whether long term insulin use (HCC)   3. Mixed hyperlipidemia    PLAN:    In order of problems listed above:  Fortunately TEE is normal she does not have any cardiac source of stroke or shunt no reason for any further evaluation at this time She tolerates aspirin until she is seen further by neurology I would continue with  discontinue her statin with muscle pain   Next appointment: As needed   Medication Adjustments/Labs and Tests Ordered: Current medicines are reviewed at length with the patient today.  Concerns regarding medicines are outlined above.  No orders of the defined types were placed in this encounter.  No orders of the defined types were placed in this encounter.    History of Present Illness:    Nancy Nunez is a 43 y.o. female with a hx of strokelike symptoms with echocardiogram performed at [facility] 05/16/2023 described as showing scant right-to-left shunt indicative of PFO or pulmonary AV malformation last seen 05/20/2023.Review of the chart shows that she had a normal MRI of the brain lipid profile showed an LDL 75 she was described as having strokelike symptoms treated with aspirin and statin.  CT brain was also normal.  CTA head neck showed no stenotic cerebrovascular disease her EKG was described as sinus rhythm cannot rule out anterior MI.  Visit she had a transesophageal echocardiogram performed 06/02/2023 despite suggestion on transthoracic echocardiogram there was no shunt or PFO detected there was no left atrial or left ventricular thrombus present and no finding of cardio myopathy  Compliance with diet, lifestyle and medications: Yes  She is pleased  with the results of her TEE has had no recurrent neurologic symptoms. He asked me whether or not she should take a statin she is having muscle pain and I told her I do not think there is a good reason and her baseline lipid profile is exceptional with an LDL of 70 and a non-HDL cholesterol of 80. 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  Rinse, gargle, and spit after use).   ALPRAZolam (XANAX) 0.25 MG tablet Take 1 tablet (0.25 mg total) by mouth at bedtime as needed for anxiety.   Ascorbic Acid (VITAMIN C) 1000 MG tablet Take 1,000 mg by mouth daily.  aspirin EC 81 MG tablet Take 1 tablet (81 mg total) by mouth daily. Swallow whole.   cetirizine (ZYRTEC) 10 MG tablet Take 10 mg by mouth daily.   EPINEPHrine (EPIPEN 2-PAK) 0.3 mg/0.3 mL IJ SOAJ injection Inject 0.3 mg into the muscle as needed for anaphylaxis.   famotidine (PEPCID) 40 MG tablet Take one tablet by mouth every night.   Ferric Maltol (ACCRUFER) 30 MG CAPS Take 1 capsule (30 mg total) by mouth in the morning and at bedtime.   Insulin Pen Needle (BD PEN NEEDLE NANO 2ND  GEN) 32G X 4 MM MISC 1 each by Other route daily.   LEVEMIR FLEXPEN 100 UNIT/ML FlexPen Inject 46 Units into the skin at bedtime.   lovastatin (MEVACOR) 10 MG tablet Take 1 tablet (10 mg total) by mouth at bedtime. Take one tab po on Mon and thur   Multiple Vitamin (MULTIVITAMIN WITH MINERALS) TABS tablet Take 1 tablet by mouth daily.   Olopatadine-Mometasone (RYALTRIS) X543819 MCG/ACT SUSP Use two sprays in each nostril one to two times per day as directed.   omeprazole (PRILOSEC) 40 MG capsule Take 1 capsule (40 mg total) by mouth in the morning.   Semaglutide, 2 MG/DOSE, (OZEMPIC, 2 MG/DOSE,) 8 MG/3ML SOPN INJECT 2 MG INTO THE SKIN ONCE A WEEK.   triamcinolone cream (KENALOG) 0.1 % Apply 1 Application topically 2 (two) times daily.   Vitamin D, Cholecalciferol, 25 MCG (1000 UT) TABS Take 1,000 Units by mouth daily.   Zinc 50 MG TABS Take 50 mg by mouth daily.      EKGs/Labs/Other Studies Reviewed:    The following studies were reviewed today:  Cardiac Studies & Procedures       TEE  ECHO TEE 06/02/2023  Narrative TRANSESOPHOGEAL ECHO REPORT    Patient Name:   Nancy Nunez Date of Exam: 06/02/2023 Medical Rec #:  [id number]        Height:       64.5 in Accession #:    [id number]       Weight:       180.0 lb Date of Birth:  [birth year]       BSA:          1.881 m Patient Age:    42 years         BP:           129/75 mmHg Patient Gender: F                HR:           90 bpm. Exam Location:  Outpatient  Procedure: 2D Echo, Cardiac Doppler and Color Doppler  Indications:     Patent foramen ovale  History:         Patient has no prior history of Echocardiogram examinations. Risk Factors:Diabetes and Dyslipidemia.  Sonographer:     Delcie Roch RDCS Referring Phys:  [id number] Baldo Daub Diagnosing Phys: Donato Schultz MD  PROCEDURE: After discussion of the risks and benefits of a TEE, an informed consent was obtained from the patient. The transesophogeal probe was  passed without difficulty through the esophogus of the patient. Imaged were obtained with the patient in a left lateral decubitus position. Sedation performed by different physician. The patient was monitored while under deep sedation. Anesthestetic sedation was provided intravenously by Anesthesiology: 210mg  of Propofol, 100mg  of Lidocaine. The patient's vital signs; including heart rate, blood pressure, and oxygen saturation; remained stable throughout the procedure. The patient developed no complications  during the procedure.  IMPRESSIONS   1. No PFO. 2. Left ventricular ejection fraction, by estimation, is 60 to 65%. The left ventricle has normal function. The left ventricle has no regional wall motion abnormalities. 3. Right ventricular systolic function is normal. The right ventricular size is normal. 4. No left atrial/left atrial appendage thrombus was detected. 5. The mitral valve is normal in structure. Mild mitral valve regurgitation. No evidence of mitral stenosis. 6. The aortic valve is tricuspid. Aortic valve regurgitation is not visualized. No aortic stenosis is present. 7. The inferior vena cava is normal in size with greater than 50% respiratory variability, suggesting right atrial pressure of 3 mmHg. 8. Agitated saline contrast bubble study was negative, with no evidence of any interatrial shunt.  Conclusion(s)/Recommendation(s): No LA/LAA thrombus identified. Negative bubble study for interatrial shunt. No intracardiac source of embolism detected on this on this transesophageal echocardiogram.  FINDINGS Left Ventricle: Left ventricular ejection fraction, by estimation, is 60 to 65%. The left ventricle has normal function. The left ventricle has no regional wall motion abnormalities. The left ventricular internal cavity size was normal in size. There is no left ventricular hypertrophy.  Right Ventricle: The right ventricular size is normal. No increase in right ventricular wall  thickness. Right ventricular systolic function is normal.  Left Atrium: Left atrial size was normal in size. No left atrial/left atrial appendage thrombus was detected.  Right Atrium: Right atrial size was normal in size.  Pericardium: There is no evidence of pericardial effusion.  Mitral Valve: The mitral valve is normal in structure. Mild mitral valve regurgitation. No evidence of mitral valve stenosis.  Tricuspid Valve: The tricuspid valve is normal in structure. Tricuspid valve regurgitation is mild . No evidence of tricuspid stenosis.  Aortic Valve: The aortic valve is tricuspid. Aortic valve regurgitation is not visualized. No aortic stenosis is present. Aortic valve mean gradient measures 3.0 mmHg. Aortic valve peak gradient measures 6.8 mmHg.  Pulmonic Valve: The pulmonic valve was normal in structure. Pulmonic valve regurgitation is not visualized. No evidence of pulmonic stenosis.  Aorta: The aortic root is normal in size and structure.  Venous: The inferior vena cava is normal in size with greater than 50% respiratory variability, suggesting right atrial pressure of 3 mmHg.  IAS/Shunts: No atrial level shunt detected by color flow Doppler. Agitated saline contrast was given intravenously to evaluate for intracardiac shunting. Agitated saline contrast bubble study was negative, with no evidence of any interatrial shunt.  Additional Comments: No PFO.  AORTIC VALVE AV Vmax:      130.00 cm/s AV Vmean:     85.100 cm/s AV VTI:       0.226 m AV Peak Grad: 6.8 mmHg AV Mean Grad: 3.0 mmHg  Donato Schultz MD Electronically signed by Donato Schultz MD Signature Date/Time: 06/02/2023/1:18:16 PM    Final  MONITORS  LONG TERM MONITOR (3-14 DAYS) 05/07/2021  Narrative Patch Wear Time:  12 days and 19 hours (2022-11-04T21:21:31-0400 to 2022-11-17T15:41:38-0500)  SUMMARY: 1. The basic rhythm is normal sinus with an average HR of 77 bpm 2. No atrial fibrillation or flutter 3. No  high-grade heart block or pathologic pauses 4. There are rare PVC's and rare supraventricular beats 5. Rare supraventricular run, limited to 6 beats without any prolonged or sustained arrhythmia    .               Recent Labs: 03/31/2023: ALT 22 05/20/2023: BUN 8; Creatinine, Ser 0.75; Hemoglobin 12.1; Platelets 291; Potassium 4.0; Sodium 141  05/22/2023: TSH 0.80  Recent Lipid Panel No results found for: "CHOL", "TRIG", "HDL", "CHOLHDL", "VLDL", "LDLCALC", "LDLDIRECT"  Physical Exam:    VS:  BP 118/80   Pulse (!) 106   Ht 5\' 4"  (1.626 m)   Wt 180 lb (81.6 kg)   SpO2 98%   BMI 30.90 kg/m     Wt Readings from Last 3 Encounters:  06/30/23 180 lb (81.6 kg)  06/02/23 178 lb (80.7 kg)  05/22/23 180 lb (81.6 kg)     GEN:  Well nourished, well developed in no acute distress HEENT: Normal NECK: No JVD; No carotid bruits LYMPHATICS: No lymphadenopathy CARDIAC: RRR, no murmurs, rubs, gallops RESPIRATORY:  Clear to auscultation without rales, wheezing or rhonchi  ABDOMEN: Soft, non-tender, non-distended MUSCULOSKELETAL:  No edema; No deformity  SKIN: Warm and dry NEUROLOGIC:  Alert and oriented x 3 PSYCHIATRIC:  Normal affect    Signed, Norman Herrlich, MD  06/30/2023 2:07 PM    [facility]

## 2023-06-30 ENCOUNTER — Ambulatory Visit: Payer: Federal, State, Local not specified - PPO | Attending: Cardiology | Admitting: Cardiology

## 2023-06-30 ENCOUNTER — Ambulatory Visit: Payer: Federal, State, Local not specified - PPO | Admitting: Internal Medicine

## 2023-06-30 ENCOUNTER — Encounter: Payer: Self-pay | Admitting: Cardiology

## 2023-06-30 VITALS — BP 118/80 | HR 106 | Ht 64.0 in | Wt 180.0 lb

## 2023-06-30 DIAGNOSIS — U071 COVID-19: Secondary | ICD-10-CM

## 2023-06-30 DIAGNOSIS — E1165 Type 2 diabetes mellitus with hyperglycemia: Secondary | ICD-10-CM | POA: Diagnosis not present

## 2023-06-30 DIAGNOSIS — F419 Anxiety disorder, unspecified: Secondary | ICD-10-CM

## 2023-06-30 DIAGNOSIS — R2990 Unspecified symptoms and signs involving the nervous system: Secondary | ICD-10-CM | POA: Diagnosis not present

## 2023-06-30 DIAGNOSIS — E782 Mixed hyperlipidemia: Secondary | ICD-10-CM

## 2023-06-30 DIAGNOSIS — M791 Myalgia, unspecified site: Secondary | ICD-10-CM | POA: Diagnosis not present

## 2023-06-30 DIAGNOSIS — Z8489 Family history of other specified conditions: Secondary | ICD-10-CM

## 2023-06-30 NOTE — Addendum Note (Signed)
Addended by: Roxanne Mins I on: 06/30/2023 02:17 PM   Modules accepted: Orders

## 2023-06-30 NOTE — Patient Instructions (Signed)
Medication Instructions:  Your physician has recommended you make the following change in your medication:   START: Lovostatin  *If you need a refill on your cardiac medications before your next appointment, please call your pharmacy*   Lab Work: None If you have labs (blood work) drawn today and your tests are completely normal, you will receive your results only by: MyChart Message (if you have MyChart) OR A paper copy in the mail If you have any lab test that is abnormal or we need to change your treatment, we will call you to review the results.   Testing/Procedures: None   Follow-Up: At [facility], you and your health needs are our priority.  As part of our continuing mission to provide you with exceptional heart care, we have created designated Provider Care Teams.  These Care Teams include your primary Cardiologist (physician) and Advanced Practice Providers (APPs -  Physician Assistants and Nurse Practitioners) who all work together to provide you with the care you need, when you need it.  We recommend signing up for the patient portal called "MyChart".  Sign up information is provided on this After Visit Summary.  MyChart is used to connect with patients for Virtual Visits (Telemedicine).  Patients are able to view lab/test results, encounter notes, upcoming appointments, etc.  Non-urgent messages can be sent to your provider as well.   To learn more about what you can do with MyChart, go to [email or web address].    Your next appointment:   Follow up as needed   Provider:   Norman Herrlich, MD    Other Instructions None

## 2023-07-07 ENCOUNTER — Encounter: Payer: Self-pay | Admitting: Internal Medicine

## 2023-07-07 ENCOUNTER — Ambulatory Visit: Payer: Federal, State, Local not specified - PPO | Admitting: Internal Medicine

## 2023-07-07 VITALS — BP 118/72 | HR 100 | Temp 98.7°F | Resp 18 | Ht 64.0 in | Wt 179.0 lb

## 2023-07-07 DIAGNOSIS — E1165 Type 2 diabetes mellitus with hyperglycemia: Secondary | ICD-10-CM | POA: Diagnosis not present

## 2023-07-07 DIAGNOSIS — G459 Transient cerebral ischemic attack, unspecified: Secondary | ICD-10-CM | POA: Diagnosis not present

## 2023-07-07 MED ORDER — MOUNJARO 10 MG/0.5ML ~~LOC~~ SOAJ: 10.0000 mg | SUBCUTANEOUS | 0 refills | Status: DC

## 2023-07-07 MED ORDER — ONDANSETRON HCL 4 MG PO TABS: 4.0000 mg | ORAL_TABLET | Freq: Every day | ORAL | 1 refills | Status: DC | PRN

## 2023-07-07 MED ORDER — MOUNJARO 7.5 MG/0.5ML ~~LOC~~ SOAJ: 7.5000 mg | SUBCUTANEOUS | 0 refills | Status: DC

## 2023-07-07 MED ORDER — MOUNJARO 5 MG/0.5ML ~~LOC~~ SOAJ: 5.0000 mg | SUBCUTANEOUS | 0 refills | Status: DC

## 2023-07-07 NOTE — Assessment & Plan Note (Signed)
She wishes to switch from ozempic to [facility].  She is to continue her current insulins and we will start her on mounjaro at 5mg  dosage.  We will check a HGBA1c on her today.

## 2023-07-07 NOTE — Progress Notes (Addendum)
Office Visit  Subjective   Patient ID: Nancy Nunez   DOB: 12/02/1980   Age: 43 y.o.   MRN: [id number]   Chief Complaint Chief Complaint  Patient presents with   Follow-up     History of Present Illness Nancy Nunez is a 43 yo female who comes in today for followup of her TIA.  She was hospitalized in 05/2024 where on 05/16/2023,  she was walking to work when she began having a dull aching pain in her left shoulder that radiated down to her fingers.  She then began having numbness and tingling from her left shoulder down to her fingers and by the time she walked into the office, she was unable to lift her left arm and she had no grip strength in her left hand.  She immediately went to [facility] ER on 05/16/2023 where they did a code stroke CT of her head and angio of her neck.  Her CT of her head showed no evidence of acute intracranial abnormality. CT angio of the neck showed no occlusion or significant stenosis. She was unable to tolerate her initial MRI due to claustrophobia and they actually intubated her and sent her for a brain MRI which was done on 05/17/2023 and this showed no acute intracranial abnormality identified.  They did an ECHO that showed a normal LV size and normal LVEF of 60-65%.  They noted a possible PFO or pulmonary AVM and she was referred to thoracic surgery.    She was started on ASA 81mg  and lipitor.  I saw her on 05/19/2023 where I asked her to restart ASA 81mg  daily and asked her to take lovastatin 10mg  daily instead of twice per week.  She began having myalgias after starting lipitor and she was not sent home on lipitor.  I also referred her to neurology whom she saw on 05/22/2023 and they also felt that she had a probable TIA.  He did some blood work and asked her to continue with ASA and lovastatin.  She did have a transesophageal ECHO done due to questionable PFO as described above.  Her TEE was done on 06/02/2023 and this showed no evidence of a PFO.  Her LVEF was 60-65%  with normal LV function and no regional wall motion abnormalities.   Her RV systolic function was normal.  Mild MR and again no evidence of atrial thrombus or a any interatrial shunt. She did go see cardiology and saw Dr. Dulce Sellar on 06/30/2023 and reviewed her TEE and asked her to continue her ASA and stop her statin due to muscle pain with lovastatin.  He wants her to followup with neurology who she sees in about 5 months.  Today, she denies any further neurologic problems or deficits recurring.    The patient is a 43 year old African American/Black female who returns for a follow-up visit for her T2 diabetes.  On her last visit, her diabetes was not controlled and I asked her to increase her lantus to 64 Units daily and increase her coverage at meals to 12 Units of regular insulin TID and continue ozempic at 2mg  subcut weekly.  Since her last visit, she has not had any problems.  Again, her insurance quit covering Januvia.  She was diagnosed with T2 diabetes in 2014 when she was 43 years old. She used to see endocrinology but has not seen them for years.  She currently on Lantus 64 Units daily and ozempic 2mg  subcut weekly . She is also supposed to  be on  novolog 10 Units daily with each meal but she states she eats several small meals during the day.  She specifically denies unexplained abdominal pain, nausea or vomiting and documented hypoglycemia. She checks blood sugars once day and they tend to range somewhere between 130 and 200 mg/dl. She came in fasting today in anticipation of lab work. Her last HgbA1c was done in 3 months ago and was 8%.   I also started her on kerendia this past year but her insurance would not cover it and she does not have protein in her urine.  I also started her on low dose Ramipril for her diabetes and also stared her on a statin this past year.  There is no long term complications of diabetic retinopathy, neuropathy, nephropathy or cardiovascular disease.  Her last dilated  diabetic eye exam was done on 09/17/2022 and this showed no evidence of diabetic retinopathy.       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  Peanuts AND MACADAMIA NUTS; PT STATES IT FEELS LIKE SHE HAS A KNOT IN HER THROAT   Pineapple Anaphylaxis and Hives   Latex Hives, Itching, Swelling and Other (See Comments)   Tramadol Other (See Comments)   Macadamia Nut Oil Other (See Comments)    Test positive on allergen test    Trazodone Other (See Comments)     Medications  Current Outpatient Medications:    ondansetron (ZOFRAN) 4 MG tablet, Take 1 tablet (4 mg total) by mouth daily as needed for nausea or vomiting., Disp: 30 tablet, Rfl: 1   [START ON 09/06/2023] tirzepatide (MOUNJARO) 10 MG/0.5ML Pen, Inject 10 mg into the skin once a week., Disp: 2 mL, Rfl: 0   [START ON 07/10/2023] tirzepatide (MOUNJARO) 5 MG/0.5ML Pen, Inject 5 mg into the skin once a week., Disp: 2 mL, Rfl: 0   [START ON 08/07/2023] tirzepatide (MOUNJARO) 7.5 MG/0.5ML Pen, Inject 7.5 mg  into the skin once a week., Disp: 2 mL, Rfl: 0   AIRSUPRA 90-80 MCG/ACT AERO, Inhale 2 puffs into the lungs as needed (every 6 hours for cough, wheeze, shortness of breath.  Rinse, gargle, and spit after use)., Disp: 10.7 g, Rfl: 1   ALPRAZolam (XANAX) 0.25 MG tablet, Take 1 tablet (0.25 mg total) by mouth at bedtime as needed for anxiety., Disp: 30 tablet, Rfl: 2   Ascorbic Acid (VITAMIN C) 1000 MG tablet, Take 1,000 mg by mouth daily., Disp: , Rfl:    aspirin EC 81 MG tablet, Take 1 tablet (81 mg total) by mouth  daily. Swallow whole., Disp: 90 tablet, Rfl: 3   cetirizine (ZYRTEC) 10 MG tablet, Take 10 mg by mouth daily., Disp: , Rfl:    EPINEPHrine (EPIPEN 2-PAK) 0.3 mg/0.3 mL IJ SOAJ injection, Inject 0.3 mg into the muscle as needed for anaphylaxis., Disp: , Rfl:    famotidine (PEPCID) 40 MG tablet, Take one tablet by mouth every night., Disp: 30 tablet, Rfl: 5   Ferric Maltol (ACCRUFER) 30 MG CAPS, Take 1 capsule (30 mg total) by mouth in the morning and at bedtime., Disp: 180 capsule, Rfl: 1   Insulin Pen Needle (BD PEN NEEDLE NANO 2ND GEN) 32G X 4 MM MISC, 1 each by Other route daily., Disp: , Rfl:    LEVEMIR FLEXPEN 100 UNIT/ML FlexPen, Inject 46 Units into the skin at bedtime., Disp: , Rfl:    Multiple Vitamin (MULTIVITAMIN WITH MINERALS) TABS tablet, Take 1 tablet by mouth daily., Disp: , Rfl:    Olopatadine-Mometasone (RYALTRIS) 665-25 MCG/ACT SUSP, Use two sprays in each nostril one to two times per day as directed., Disp: 29 g, Rfl: 5   omeprazole (PRILOSEC) 40 MG capsule, Take 1 capsule (40 mg total) by mouth in the morning., Disp: 30 capsule, Rfl: 5   Semaglutide, 2 MG/DOSE, (OZEMPIC, 2 MG/DOSE,) 8 MG/3ML SOPN, INJECT 2 MG INTO THE SKIN ONCE A WEEK., Disp: 3 mL, Rfl: 1   triamcinolone cream (KENALOG) 0.1 %, Apply 1 Application topically 2 (two) times daily., Disp: 30 g, Rfl: 5   Vitamin D, Cholecalciferol, 25 MCG (1000 UT) TABS, Take 1,000 Units by mouth daily., Disp: , Rfl:    Zinc  50 MG TABS, Take 50 mg by mouth daily., Disp: , Rfl:    Review of Systems Review of Systems  Constitutional:  Negative for chills, fever and malaise/fatigue.  Eyes:  Negative for blurred vision and double vision.  Respiratory:  Negative for cough and shortness of breath.   Cardiovascular:  Negative for chest pain, palpitations and leg swelling.  Gastrointestinal:  Positive for nausea. Negative for abdominal pain, constipation, diarrhea and vomiting.  Genitourinary:  Negative for frequency.  Musculoskeletal:  Negative for myalgias.  Skin:  Negative for itching and rash.  Neurological:  Negative for dizziness, weakness and headaches.  Endo/Heme/Allergies:  Negative for polydipsia.       Objective:    Vitals BP 118/72   Pulse 100   Temp 98.7 F (37.1 C)   Resp 18   Ht 5\' 4"  (1.626 m)   Wt 179 lb (81.2 kg)   SpO2 98%   BMI 30.73 kg/m    Physical Examination Physical Exam Constitutional:      Appearance: Normal appearance. She is not ill-appearing.  Cardiovascular:     Rate and Rhythm: Normal rate and regular rhythm.     Pulses: Normal pulses.     Heart sounds: No murmur heard.    No friction rub. No gallop.  Pulmonary:     Effort: Pulmonary effort is normal. No respiratory distress.     Breath sounds: No wheezing, rhonchi or rales.  Abdominal:     General: Bowel sounds are normal. There is no distension.     Palpations: Abdomen is soft.     Tenderness: There is no abdominal tenderness.  Musculoskeletal:     Right lower leg: No edema.     Left lower leg: No edema.  Skin:    General: Skin is warm and dry.     Findings: No rash.  Neurological:  Mental Status: She is alert.        Assessment & Plan:   TIA (transient ischemic attack) She has seen both neurology and cardiology.  Cardiology wanted her to stop the statin due to myalgias where she has had myalgias to both lovastatin and lipitor.  She will continue risk factor modification and ASA.  Inadequately  controlled diabetes mellitus (HCC) She wishes to switch from ozempic to [facility].  She is to continue her current insulins and we will start her on mounjaro at 5mg  dosage.  We will check a HGBA1c on her today.    Return in about 3 months (around 10/05/2023) for annual.   Crist Fat, MD

## 2023-07-07 NOTE — Assessment & Plan Note (Signed)
She has seen both neurology and cardiology.  Cardiology wanted her to stop the statin due to myalgias where she has had myalgias to both lovastatin and lipitor.  She will continue risk factor modification and ASA.

## 2023-07-08 LAB — HEMOGLOBIN A1C
Est. average glucose Bld gHb Est-mCnc: 197 mg/dL
Hgb A1c MFr Bld: 8.5 % — ABNORMAL HIGH (ref 4.8–5.6)

## 2023-07-14 ENCOUNTER — Ambulatory Visit: Payer: Federal, State, Local not specified - PPO | Admitting: Allergy and Immunology

## 2023-07-21 ENCOUNTER — Ambulatory Visit: Payer: Federal, State, Local not specified - PPO | Admitting: Allergy and Immunology

## 2023-07-25 NOTE — Progress Notes (Signed)
Tell her to increase her lantus to 68 units daily.   Patient is aware of labs

## 2023-07-28 ENCOUNTER — Ambulatory Visit: Payer: Federal, State, Local not specified - PPO | Admitting: Allergy and Immunology

## 2023-07-28 ENCOUNTER — Encounter: Payer: Self-pay | Admitting: Allergy and Immunology

## 2023-07-28 VITALS — BP 126/86 | HR 96 | Resp 16

## 2023-07-28 DIAGNOSIS — J3089 Other allergic rhinitis: Secondary | ICD-10-CM

## 2023-07-28 DIAGNOSIS — Z91018 Allergy to other foods: Secondary | ICD-10-CM | POA: Diagnosis not present

## 2023-07-28 DIAGNOSIS — J301 Allergic rhinitis due to pollen: Secondary | ICD-10-CM

## 2023-07-28 DIAGNOSIS — J4520 Mild intermittent asthma, uncomplicated: Secondary | ICD-10-CM

## 2023-07-28 DIAGNOSIS — K219 Gastro-esophageal reflux disease without esophagitis: Secondary | ICD-10-CM

## 2023-07-28 MED ORDER — AZELASTINE-FLUTICASONE 137-50 MCG/ACT NA SUSP: NASAL | 5 refills | Status: AC

## 2023-07-28 NOTE — Patient Instructions (Addendum)
  1. Avoid - dust mite, animals, pollens, alternaria.  2. Can arrange for in clinic food challenge with pineapple  3. Treat and prevent inflammation of airway:   A. Dymista - 1-2 sprays each nostril 1-2 times per day (replaces Ryaltris)  4. Treat and prevent reflux induced inflammation of airway:   A. Omeprazole 40 mg - 1 tablet in AM  B. Famotidine 40 mg - 1 tablet in PM  C. Minimize caffeine / chocolate consumption  D. Replace throat clearing with swallowing / drinking maneuver  5. If needed:   A. Airsupra - 2 inhalations every 6 hours (replaces albuterol)  B. Antihistamines  C. Pataday - 2 drop each eye 1 time per day D. Epi-Pen  6. Return to clinic in 6 months or earlier if problem

## 2023-07-28 NOTE — Progress Notes (Unsigned)
[place] [facility] [place] - [place] - [place] -    Follow-up Note  Referring Provider: Crist Fat, MD Primary Provider: Leonia Nunez, Nancy Cower, MD Date of Office Visit: 07/28/2023  Subjective:   Nancy Nunez (DOB: [birth year]) is a 43 y.o. female who [facility] in re-evaluation of the following:  HPI: Nancy Nunez returns to this clinic in evaluation of asthma, allergic rhinitis, LPR, food allergy, abdominal bloating.  I last saw her in this clinic during her initial evaluation 05 May 2023.  Currently she is doing better on a collection of medical therapy directed against inflammation of her airway and reflux.  A lot of her throat issue has resolved although she still occasionally has some postnasal drip at nighttime but this is a lot better with her current medical therapy.  She has been using omeprazole and famotidine on a consistent basis and she is only having caffeine about twice a week or so.  Her nose at this point is doing pretty well but she cannot really tolerate the taste of Ryaltris and she is only using this medication at 1 spray 1 time per day.  Her asthma has been relatively nonexistent and she has only had to use her rescue inhaler 1 time since her last visit.  Her abdominal issue is doing much better and she has much less bloating at this point in time.  Her bloating may have been a side effect of using Ozempic for she is now on Mounjaro and although her bloating is a lot better she has noticed a little bit of nausea since she has made the switch.  Allergies as of 07/28/2023       Reactions   Bee Venom Anaphylaxis   Peanut-containing Drug Products Anaphylaxis, Other (See Comments), Swelling   Pt states she felt like something was in her throat after eating peanuts. Peanuts AND MACADAMIA NUTS; PT STATES IT FEELS LIKE SHE HAS A KNOT IN HER THROAT   Pineapple Anaphylaxis, Hives   Latex Hives, Itching, Swelling, Other  (See Comments)   Tramadol Other (See Comments)   Macadamia Nut Oil Other (See Comments)   Test positive on allergen test    Trazodone Other (See Comments)        Medication List    ACCRUFeR 30 MG Caps Generic drug: Ferric Maltol Take 1 capsule (30 mg total) by mouth in the morning and at bedtime.   Airsupra 90-80 MCG/ACT Aero Generic drug: Albuterol-Budesonide Inhale 2 puffs into the lungs as needed (every 6 hours for cough, wheeze, shortness of breath.  Rinse, gargle, and spit after use).   ALPRAZolam 0.25 MG tablet Commonly known as: XANAX Take 1 tablet (0.25 mg total) by mouth at bedtime as needed for anxiety.   aspirin EC 81 MG tablet Take 1 tablet (81 mg total) by mouth daily. Swallow whole.   Azelastine-Fluticasone 137-50 MCG/ACT Susp Can use one to two sprays in each nostril one to two times daily. Started by: Nancy Nunez   BD Pen Needle Nano 2nd Gen 32G X 4 MM Misc Generic drug: Insulin Pen Needle 1 each by Other route daily.   cetirizine 10 MG tablet Commonly known as: ZYRTEC Take 10 mg by mouth daily.   EpiPen 2-Pak 0.3 mg/0.3 mL Soaj injection Generic drug: EPINEPHrine Inject 0.3 mg into the muscle as needed for anaphylaxis.   famotidine 40 MG tablet Commonly known as: PEPCID Take one tablet by mouth every night.   Levemir  FlexPen 100 UNIT/ML FlexPen Generic drug: insulin detemir Inject 46 Units into the skin at bedtime.   Mounjaro 5 MG/0.5ML Pen Generic drug: tirzepatide Inject 5 mg into the skin once a week.   multivitamin with minerals Tabs tablet Take 1 tablet by mouth daily.   NUTRITIONAL SUPPLEMENT PO Take by mouth at bedtime. Ashwaganda   omeprazole 40 MG capsule Commonly known as: PRILOSEC Take 1 capsule (40 mg total) by mouth in the morning.   ondansetron 4 MG tablet Commonly known as: Zofran Take 1 tablet (4 mg total) by mouth daily as needed for nausea or vomiting.   triamcinolone cream 0.1 % Commonly known as:  KENALOG Apply 1 Application topically 2 (two) times daily.   vitamin C 1000 MG tablet Take 1,000 mg by mouth daily.   Vitamin D (Cholecalciferol) 25 MCG (1000 UT) Tabs Take 1,000 Units by mouth daily.   Zinc 50 MG Tabs Take 50 mg by mouth daily.    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   Obesity 05/20/2023   PFO (patent foramen ovale) 05/19/2023   Tachycardia 12/23/2022   TIA (transient ischemic attack) 05/19/2023   Type 2 diabetes mellitus with hyperglycemia (HCC) 01/04/2019    Past Surgical History:  Procedure Laterality Date   CHOLECYSTECTOMY     ESOPHAGOGASTRODUODENOSCOPY     x3   GYNECOLOGIC CRYOSURGERY     PAROTIDECTOMY Left 02/13/2022   Procedure: PAROTIDECTOMY;  Surgeon: Vernie Murders, MD;  Location: [facility] ORS;  Service: ENT;  Laterality: Left;   SUPERFICIAL LYMPH NODE BIOPSY / EXCISION     TRANSESOPHAGEAL ECHOCARDIOGRAM (CATH LAB) N/A 06/02/2023   Procedure: TRANSESOPHAGEAL ECHOCARDIOGRAM;  Surgeon: Jake Bathe, MD;  Location: [facility];  Service: Cardiovascular;  Laterality: N/A;    Review of systems negative except as noted in HPI / PMHx or noted below:  Review of Systems  Constitutional: Negative.   HENT: Negative.    Eyes: Negative.   Respiratory: Negative.    Cardiovascular: Negative.   Gastrointestinal: Negative.   Genitourinary: Negative.   Musculoskeletal: Negative.   Skin: Negative.   Neurological: Negative.   Endo/Heme/Allergies: Negative.    Psychiatric/Behavioral: Negative.       Objective:   Vitals:   07/28/23 1119  BP: 126/86  Pulse: 96  Resp: 16  SpO2: 97%          Physical Exam Constitutional:      Appearance: She is not diaphoretic.  HENT:     Head: Normocephalic.     Right Ear: Tympanic membrane, ear canal and external ear normal.     Left Ear: Tympanic membrane, ear canal and external ear normal.     Nose: Nose normal. No mucosal edema or rhinorrhea.     Mouth/Throat:     Pharynx: Uvula midline. No oropharyngeal exudate.  Eyes:     Conjunctiva/sclera: Conjunctivae normal.  Neck:     Thyroid: No thyromegaly.     Trachea: Trachea normal. No tracheal tenderness or tracheal deviation.  Cardiovascular:     Rate and Rhythm: Normal rate and regular rhythm.     Heart sounds: Normal heart sounds, S1 normal and S2 normal. No murmur heard. Pulmonary:     Effort: No respiratory distress.     Breath sounds: Normal breath sounds. No stridor. No wheezing or rales.  Lymphadenopathy:     Head:     Right side of head: No tonsillar adenopathy.     Left side of head: No tonsillar adenopathy.     Cervical: No cervical adenopathy.  Skin:    Findings: No erythema or rash.     Nails: There is no clubbing.  Neurological:     Mental Status: She is alert.     Diagnostics: Spirometry was performed and demonstrated an FEV1 of 1.84 at 72 % of predicted.   Results of blood test obtained 05 May 2023 identified IgE antibody directed against macadamia nut at 1.84 KU/L, the rest of her nut panel was negative, negative wheat IgE, negative soybean IgE, negative pineapple IgE, transglutaminase IgA less than 2U/ML, with IgA 226 Mg/DL  Assessment and Plan:   1. Perennial allergic rhinitis   2. Seasonal allergic rhinitis due to pollen   3. Asthma, mild intermittent, well-controlled   4. Food allergy   5. LPRD (laryngopharyngeal reflux disease)    1. Avoid - dust mite, animals, pollens, alternaria.  2. Can arrange  for in clinic food challenge with pineapple  3. Treat and prevent inflammation of airway:   A. Dymista - 1-2 sprays each nostril 1-2 times per day (replaces Ryaltris)  4. Treat and prevent reflux induced inflammation of airway:   A. Omeprazole 40 mg - 1 tablet in AM  B. Famotidine 40 mg - 1 tablet in PM  C. Minimize caffeine / chocolate consumption  D. Replace throat clearing with swallowing / drinking maneuver  5. If needed:   A. Airsupra - 2 inhalations every 6 hours (replaces albuterol)  B. Antihistamines  C. Pataday - 2 drop each eye 1 time per day D. Epi-Pen  6. Return to clinic in 6 months or earlier if problem  Vernel is interested in starting a course of immunotherapy and we have given her literature on this form of treatment and she is presently considering this option.  We need to change her nasal spray to a different combination as she has a taste perversion when using her current mometasone and olopatadine spray.  Hopefully she will tolerate the fluticasone and azelastine spray.  She is doing very well regarding her reflux issue and she can continue on omeprazole and she can add in famotidine should it be required.  She has the option of an in-clinic food challenge with fresh pineapple and it sounds as though she will be arranging that challenge at some point in the near future.  Laurette Schimke, MD Allergy / [facility]

## 2023-07-29 ENCOUNTER — Encounter: Payer: Self-pay | Admitting: Allergy and Immunology

## 2023-08-11 ENCOUNTER — Encounter: Payer: Federal, State, Local not specified - PPO | Admitting: Allergy and Immunology

## 2023-08-11 DIAGNOSIS — J309 Allergic rhinitis, unspecified: Secondary | ICD-10-CM

## 2023-08-18 ENCOUNTER — Other Ambulatory Visit: Payer: Self-pay | Admitting: Internal Medicine

## 2023-08-18 MED ORDER — FLUCONAZOLE 150 MG PO TABS
150.0000 mg | ORAL_TABLET | Freq: Every day | ORAL | 0 refills | Status: DC
Start: 2023-08-18 — End: 2023-11-18

## 2023-09-08 DIAGNOSIS — Z01419 Encounter for gynecological examination (general) (routine) without abnormal findings: Secondary | ICD-10-CM | POA: Diagnosis not present

## 2023-09-15 DIAGNOSIS — Z1231 Encounter for screening mammogram for malignant neoplasm of breast: Secondary | ICD-10-CM | POA: Diagnosis not present

## 2023-09-25 NOTE — Progress Notes (Deleted)
 NEUROLOGY FOLLOW UP OFFICE NOTE  Nancy Nunez [id number]  Subjective:  Nancy Nunez is a 43 y.o. year old right-handed female with a medical history of DM, asthma, anxiety, GERD who we last saw on 05/22/23 for transient left arm pain and weakness.  To briefly review: 05/22/23: On 05/16/23, patient had a sudden onset pain and heaviness and tingling sensation in left arm. She could not move her arm either. She was sent across the street to the ED. CTH showed no acute process. CTA head and neck showed no LVO or significant stenosis. MRI brain (under general anesthesia) on 05/17/23 that showed no acute abnormality. Echo showed normal LV function and possible PFO. Patient is now seeing cardiology (05/20/23). Per patient, cardiology thinks symptoms are related to this and not neurologic. She is scheduled to get a TEE soon.    She is back to normal now. Her heaviness and pain lasted about 45 minutes. The tingling lasted about 24 hours. Her weakness improved later that night.   While in the hospital, patient was started on aspirin 81 mg and Plavix 75 mg daily, with a plan for 21 days. She only took plavix for 3 days though because PCP and cardiologist did not think this was needed. She is also on lovastatin 10 mg daily. She is taking aspirin 81 mg daily.   She has had no further symptoms since that day. She does have some muscle aches all over from taking Lipitor. This is improving.   The patient has not had similar episodes of symptoms in the past.     She report any constitutional symptoms like fever, night sweats, anorexia or unintentional weight loss.   EtOH use: 1-2 drinks, 1-2 times per month Non-smoker  Restrictive diet? No Family history of neuropathy/myopathy/neurologic disease? Mother with epilepsy, grandmother with neuropathy, mother also has diabetic neuropathy  Most recent Assessment and Plan (05/22/23): Nancy Nunez is a 43 y.o. female who presents for evaluation of  transient left arm pain and weakness resolving completely in about 24 hours. She has a relevant medical history of DM, asthma, anxiety, GERD. Her neurological examination is normal today. Available diagnostic data is significant for MRI brain showing no stroke, CTA head and neck showing no occlusion or significant stenosis, and echocardiogram normal except possible PFO. The etiology of patient's symptoms is unclear, but the most concerning possibility is TIA. While pain in unusual in TIA, her symptoms did seem to resolve in less than 24 hours with a normal MRI brain, which meets criteria. There does not appear to be a peripheral source of symptoms, nor would this be expected to resolve so quickly. After discussion, we agreed to treat as TIA as the safest course of action.   PLAN: -Blood work: TSH -Continue aspirin 81 mg daily -Continue lovastatin 10 mg daily -Stroke precautions discusssed -Follow up with cardiology as planned  Since their last visit: ***  MEDICATIONS:  Outpatient Encounter Medications as of 10/03/2023  Medication Sig   AIRSUPRA 90-80 MCG/ACT AERO Inhale 2 puffs into the lungs as needed (every 6 hours for cough, wheeze, shortness of breath.  Rinse, gargle, and spit after use).   ALPRAZolam (XANAX) 0.25 MG tablet Take 1 tablet (0.25 mg total) by mouth at bedtime as needed for anxiety.   Ascorbic Acid (VITAMIN C) 1000 MG tablet Take 1,000 mg by mouth daily.   aspirin EC 81 MG tablet Take 1 tablet (81 mg total) by mouth daily. Swallow whole.   Azelastine-Fluticasone 137-50 MCG/ACT SUSP Can  use one to two sprays in each nostril one to two times daily.   cetirizine (ZYRTEC) 10 MG tablet Take 10 mg by mouth daily.   EPINEPHrine (EPIPEN 2-PAK) 0.3 mg/0.3 mL IJ SOAJ injection Inject 0.3 mg into the muscle as needed for anaphylaxis.   famotidine (PEPCID) 40 MG tablet Take one tablet by mouth every night.   Ferric Maltol (ACCRUFER) 30 MG CAPS Take 1 capsule (30 mg total) by mouth in the  morning and at bedtime.   fluconazole (DIFLUCAN) 150 MG tablet Take 1 tablet (150 mg total) by mouth daily.   Insulin Pen Needle (BD PEN NEEDLE NANO 2ND GEN) 32G X 4 MM MISC 1 each by Other route daily.   LEVEMIR FLEXPEN 100 UNIT/ML FlexPen Inject 46 Units into the skin at bedtime.   Multiple Vitamin (MULTIVITAMIN WITH MINERALS) TABS tablet Take 1 tablet by mouth daily.   Nutritional Supplements (NUTRITIONAL SUPPLEMENT PO) Take by mouth at bedtime. Ashwaganda   omeprazole (PRILOSEC) 40 MG capsule Take 1 capsule (40 mg total) by mouth in the morning.   ondansetron (ZOFRAN) 4 MG tablet Take 1 tablet (4 mg total) by mouth daily as needed for nausea or vomiting.   tirzepatide (MOUNJARO) 5 MG/0.5ML Pen Inject 5 mg into the skin once a week.   triamcinolone cream (KENALOG) 0.1 % Apply 1 Application topically 2 (two) times daily.   Vitamin D, Cholecalciferol, 25 MCG (1000 UT) TABS Take 1,000 Units by mouth daily.   Zinc 50 MG TABS Take 50 mg by mouth daily.   No facility-administered encounter medications on file as of 10/03/2023.    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   Obesity 05/20/2023   PFO (patent foramen ovale) 05/19/2023   Tachycardia 12/23/2022   TIA (transient ischemic attack) 05/19/2023   Type 2 diabetes mellitus with hyperglycemia (HCC) 01/04/2019    PAST SURGICAL HISTORY: Past Surgical History:  Procedure Laterality Date   CHOLECYSTECTOMY      ESOPHAGOGASTRODUODENOSCOPY     x3   GYNECOLOGIC CRYOSURGERY     PAROTIDECTOMY Left 02/13/2022   Procedure: PAROTIDECTOMY;  Surgeon: Mellody Sprout, MD;  Location: [facility] ORS;  Service: ENT;  Laterality: Left;   SUPERFICIAL LYMPH NODE BIOPSY / EXCISION     TRANSESOPHAGEAL ECHOCARDIOGRAM (CATH LAB) N/A 06/02/2023   Procedure: TRANSESOPHAGEAL ECHOCARDIOGRAM;  Surgeon: Hugh Madura, MD;  Location: [facility];  Service: Cardiovascular;  Laterality: N/A;    ALLERGIES: Allergies  Allergen Reactions   Bee Venom Anaphylaxis   Peanut-Containing Drug Products Anaphylaxis, Other (See Comments) and Swelling    Pt states she felt like something was in her throat after eating peanuts.  Peanuts AND MACADAMIA NUTS; PT STATES IT FEELS LIKE SHE HAS A KNOT IN HER THROAT   Pineapple Anaphylaxis and Hives   Latex Hives, Itching, Swelling and Other (See Comments)   Tramadol Other (See Comments)   Macadamia Nut Oil Other (See Comments)    Test positive on allergen test  Trazodone Other (See Comments)    FAMILY HISTORY: Family History  Problem Relation Age of Onset   Diabetes Mother    High Cholesterol Mother    Heart disease Mother    Epilepsy Mother    High Cholesterol Father    Diabetes Father    Hypertension Father    Throat cancer Maternal Grandfather     SOCIAL HISTORY: Social History   Tobacco Use   Smoking status: Never   Smokeless tobacco: Never  Vaping Use   Vaping status: Never Used  Substance Use Topics   Alcohol use: Yes    Comment: occas   Drug use: Never   Social History   Social History Narrative   Are you right handed or left handed? Right   Are you currently employed ?    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    What type of home do you live in: 1 story or 2 story? one    Caffiene 1 cup daily      Objective:  Vital Signs:  There were no vitals taken for this visit.  ***  Labs and Imaging  review: New results: HbA1c (07/07/23): 8.5 TSH (05/22/23): wnl  Previously reviewed results: 05/19/23: CK: 142   Celiac panel (05/05/23): negative   External labs: 05/18/23: CBC unremarkable BMP unremarkable   Lipid panel (05/17/23): tChol 151, LDL 75, TG 88 HbA1c (05/16/23): 8.6   External Imaging: MRI Head WO Contrast (05/17/23): No stroke or concerning pathology per my read.   Per radiology: Negative for acute infarct.  No acute intracranial hemorrhage identified.  No midline shift or acute mass effect.  No abnormal extra-axial fluid collection identified.  Ventricles, cisterns, and sulci are unremarkable.  Negative for hydrocephalus.  Major basilar intracranial flow voids are patent.  Orbits appear unremarkable.  Paranasal sinuses are clear.  Mild left mastoid effusion.  No suspicious osseous lesions identified.  IMPRESSION:  No acute intracranial abnormality identified.    CTA head and neck (05/16/23): No LVO or significant stenosis per my read. Per radiology: CT Code Stroke Head Neck Angio  Final Result  IMPRESSION:  1. No occlusion or significant stenosis.    Echocardiogram (05/16/23): Echocardiogram Complete WO Enhancing Agent  Final Result  Left Ventricle: Left ventricle size is normal.  Left Ventricle: Systolic function is normal. EF: 60-65%. Quantitative  analysis of left ventricular Global Longitudinal Strain (GLS) imaging is  -22.700%. Ejection fraction measured by 3D is 60%,  Left Atrium: Injection of agitated saline documents few late bubbles on  left side suggestive of small PFO or pulmonary AVM.  Right Ventricle: Right ventricle size is normal. Systolic function is  normal.    MRI cervical spine (11/07/20): FINDINGS: Intermittently motion degraded examination. Most notably, there is mild motion degradation of the sagittal STIR sequence, and moderate motion degradation of the axial T2 TSE sequence.   Alignment: Reversal of the expected cervical  lordosis. No significant spondylolisthesis.   Vertebrae: Vertebral body height is maintained. No significant marrow edema or focal suspicious osseous lesion.   Cord: Within the limitations of motion degradation, no spinal cord signal abnormality is identified.   Posterior Fossa, vertebral arteries, paraspinal tissues: No abnormality identified within included portions of the posterior fossa. Flow voids preserved within the imaged cervical vertebral arteries. Paraspinal soft tissues within normal limits.   Disc levels:   No more than mild disc degeneration at any level.   C2-C3:  No significant disc herniation or stenosis.   C3-C4: No significant disc herniation or stenosis.   C4-C5: Small central disc protrusion. Mild partial effacement of the ventral thecal sac with possible contact upon the ventral spinal cord. No significant foraminal stenosis.   C5-C6: Small central disc protrusion. Mild partial effacement of the ventral thecal sac with possible contact upon the ventral spinal cord. No significant foraminal stenosis.   C6-C7: Small central disc protrusion. Mild partial effacement of the ventral thecal sac with possible contact upon the ventral spinal cord. No significant foraminal stenosis.   C7-T1: No significant disc herniation or stenosis.   IMPRESSION: Motion degraded examination.   Cervical spondylosis, as outlined and with findings most notably as follows.   There are small central disc protrusions at C4-C5, C5-C6 and C6-C7 with resultant mild relative spinal canal narrowing and possible contact upon the ventral spinal cord.   No significant foraminal stenosis.   No more than mild disc degeneration within the cervical spine.  Assessment/Plan:  This is Nancy Nunez, a 43 y.o. female with: ***   Plan: ***  Return to clinic in ***  Total time spent reviewing records, interview, history/exam, documentation, and coordination of care on day of  encounter:  *** min  Rommie Coats, MD

## 2023-10-03 ENCOUNTER — Ambulatory Visit: Payer: Federal, State, Local not specified - PPO | Admitting: Neurology

## 2023-10-13 ENCOUNTER — Ambulatory Visit: Payer: Federal, State, Local not specified - PPO | Admitting: Internal Medicine

## 2023-10-13 ENCOUNTER — Encounter: Payer: Self-pay | Admitting: Internal Medicine

## 2023-10-13 VITALS — BP 120/78 | HR 104 | Temp 98.2°F | Resp 18 | Ht 64.0 in | Wt 174.8 lb

## 2023-10-13 DIAGNOSIS — J4520 Mild intermittent asthma, uncomplicated: Secondary | ICD-10-CM | POA: Insufficient documentation

## 2023-10-13 DIAGNOSIS — Z8673 Personal history of transient ischemic attack (TIA), and cerebral infarction without residual deficits: Secondary | ICD-10-CM | POA: Diagnosis not present

## 2023-10-13 DIAGNOSIS — E66811 Obesity, class 1: Secondary | ICD-10-CM

## 2023-10-13 DIAGNOSIS — Z6830 Body mass index (BMI) 30.0-30.9, adult: Secondary | ICD-10-CM | POA: Diagnosis not present

## 2023-10-13 DIAGNOSIS — D649 Anemia, unspecified: Secondary | ICD-10-CM

## 2023-10-13 DIAGNOSIS — J301 Allergic rhinitis due to pollen: Secondary | ICD-10-CM | POA: Insufficient documentation

## 2023-10-13 DIAGNOSIS — N921 Excessive and frequent menstruation with irregular cycle: Secondary | ICD-10-CM

## 2023-10-13 DIAGNOSIS — D508 Other iron deficiency anemias: Secondary | ICD-10-CM

## 2023-10-13 DIAGNOSIS — E1165 Type 2 diabetes mellitus with hyperglycemia: Secondary | ICD-10-CM

## 2023-10-13 DIAGNOSIS — D509 Iron deficiency anemia, unspecified: Secondary | ICD-10-CM

## 2023-10-13 DIAGNOSIS — Z0000 Encounter for general adult medical examination without abnormal findings: Secondary | ICD-10-CM | POA: Diagnosis not present

## 2023-10-13 DIAGNOSIS — F411 Generalized anxiety disorder: Secondary | ICD-10-CM | POA: Insufficient documentation

## 2023-10-13 DIAGNOSIS — E6609 Other obesity due to excess calories: Secondary | ICD-10-CM

## 2023-10-13 DIAGNOSIS — K219 Gastro-esophageal reflux disease without esophagitis: Secondary | ICD-10-CM

## 2023-10-13 DIAGNOSIS — D259 Leiomyoma of uterus, unspecified: Secondary | ICD-10-CM | POA: Insufficient documentation
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
## 2023-10-13 NOTE — Assessment & Plan Note (Signed)
 She is having discussions with OB/GYN and they are going to repeat another Uterine US  next month.

## 2023-10-13 NOTE — Assessment & Plan Note (Signed)
 Her diabetic foot exam was normal.  She just took her last mounjaro 10mg  shot today.  We will adjust her mounjaro as needed once we get back her HgBa1c.

## 2023-10-13 NOTE — Assessment & Plan Note (Signed)
 She is doing excellent with eating healthy and exercising.  She will continue to lose weight.

## 2023-10-13 NOTE — Assessment & Plan Note (Signed)
 She remains on accufer.  We will check her blood counts and iron levels.  She has anemia secondary to her uterine fibroids.

## 2023-10-13 NOTE — Assessment & Plan Note (Signed)
 Continue omeprazole  and pepcid  which is controlling her symptoms.

## 2023-10-13 NOTE — Assessment & Plan Note (Signed)
 Health maintenance discussed.  She will need a colonoscopy at the end of the year when she turns 43.  We will obtain some yearly labs.

## 2023-10-13 NOTE — Assessment & Plan Note (Signed)
 She saw neurology and they felt it was best to consider her episode she has as a TIA.  I am going to start her on low dose pravastatin since she had myalgias to lovastatin  and lipitor in the past.  Continue on ASA.  She will followup with neurology in a few months.

## 2023-10-13 NOTE — Assessment & Plan Note (Signed)
 She is following Dr. Kozlow from immunology and she has mild intermittent asthma that is well controlled.  She hardly uses her airsupra .

## 2023-10-13 NOTE — Assessment & Plan Note (Signed)
 She is going to start on immunotherapy.

## 2023-10-13 NOTE — Assessment & Plan Note (Signed)
 Plan as above.

## 2023-10-13 NOTE — Assessment & Plan Note (Signed)
 She will continue to use xanax  as needed.  Her anxiety is doing a lot better this past year.

## 2023-10-14 LAB — CBC WITH DIFFERENTIAL/PLATELET
Basophils Absolute: 0 10*3/uL (ref 0.0–0.2)
Basos: 1 %
EOS (ABSOLUTE): 0 10*3/uL (ref 0.0–0.4)
Eos: 0 %
Hematocrit: 35.8 % (ref 34.0–46.6)
Hemoglobin: 11.4 g/dL (ref 11.1–15.9)
Immature Grans (Abs): 0 10*3/uL (ref 0.0–0.1)
Immature Granulocytes: 0 %
Lymphocytes Absolute: 2.3 10*3/uL (ref 0.7–3.1)
Lymphs: 38 %
MCH: 26.1 pg — ABNORMAL LOW (ref 26.6–33.0)
MCHC: 31.8 g/dL (ref 31.5–35.7)
MCV: 82 fL (ref 79–97)
Monocytes Absolute: 0.3 10*3/uL (ref 0.1–0.9)
Monocytes: 5 %
Neutrophils Absolute: 3.3 10*3/uL (ref 1.4–7.0)
Neutrophils: 56 %
Platelets: 373 10*3/uL (ref 150–450)
RBC: 4.36 x10E6/uL (ref 3.77–5.28)
RDW: 12.9 % (ref 11.7–15.4)
WBC: 6 10*3/uL (ref 3.4–10.8)

## 2023-10-14 LAB — LIPID PANEL
Chol/HDL Ratio: 2.3 ratio (ref 0.0–4.4)
Cholesterol, Total: 161 mg/dL (ref 100–199)
HDL: 69 mg/dL (ref >39)
LDL Chol Calc (NIH): 80 mg/dL (ref 0–99)
Triglycerides: 63 mg/dL (ref 0–149)
VLDL Cholesterol Cal: 12 mg/dL (ref 5–40)

## 2023-10-14 LAB — MICROALBUMIN / CREATININE URINE RATIO
Creatinine, Urine: 141.8 mg/dL
Microalb/Creat Ratio: 5 mg/g{creat} (ref 0–29)
Microalbumin, Urine: 7.4 ug/mL

## 2023-10-14 LAB — CMP14 + ANION GAP
ALT: 15 IU/L (ref 0–32)
AST: 20 IU/L (ref 0–40)
Albumin: 4.6 g/dL (ref 3.9–4.9)
Alkaline Phosphatase: 65 IU/L (ref 44–121)
Anion Gap: 13 mmol/L (ref 10.0–18.0)
BUN/Creatinine Ratio: 15 (ref 9–23)
BUN: 9 mg/dL (ref 6–24)
Bilirubin Total: 0.4 mg/dL (ref 0.0–1.2)
CO2: 20 mmol/L (ref 20–29)
Calcium: 9.3 mg/dL (ref 8.7–10.2)
Chloride: 103 mmol/L (ref 96–106)
Creatinine, Ser: 0.61 mg/dL (ref 0.57–1.00)
Globulin, Total: 2.8 g/dL (ref 1.5–4.5)
Glucose: 180 mg/dL — ABNORMAL HIGH (ref 70–99)
Potassium: 4.4 mmol/L (ref 3.5–5.2)
Sodium: 136 mmol/L (ref 134–144)
Total Protein: 7.4 g/dL (ref 6.0–8.5)
eGFR: 113 mL/min/{1.73_m2} (ref >59)

## 2023-10-14 LAB — HEMOGLOBIN A1C
Est. average glucose Bld gHb Est-mCnc: 209 mg/dL
Hgb A1c MFr Bld: 8.9 % — ABNORMAL HIGH (ref 4.8–5.6)

## 2023-10-14 LAB — IRON: Iron: 55 ug/dL (ref 27–159)

## 2023-10-14 LAB — TSH: TSH: 0.744 u[IU]/mL (ref 0.450–4.500)

## 2023-10-14 LAB — FERRITIN: Ferritin: 11 ng/mL — ABNORMAL LOW (ref 15–150)

## 2023-10-15 ENCOUNTER — Other Ambulatory Visit: Payer: Self-pay

## 2023-10-15 MED ORDER — ONDANSETRON HCL 4 MG PO TABS: 4.0000 mg | ORAL_TABLET | Freq: Every day | ORAL | 1 refills | Status: AC | PRN

## 2023-10-15 NOTE — Progress Notes (Signed)
 Call and tell her to increase her mounjaro (after she has done 1 month of mounjaro 10mg ) to mounjaro 12.5mg  subcut weekly x 1 month, then 15mg  subcut weekly thereafter with 5 months of refills. Her ferritin level remains low. Set her up for a ferreheme injection for one week and then again the next week. Continue and send a refill of accufer 30mg  BID. Increase her lantus  to 70 Units daily but split it to lantus  35 Units BID.  Patient is aware of labs, and instructions

## 2023-10-15 NOTE — Progress Notes (Signed)
 Rx refill

## 2023-10-21 DIAGNOSIS — D251 Intramural leiomyoma of uterus: Secondary | ICD-10-CM | POA: Diagnosis not present

## 2023-10-21 DIAGNOSIS — N938 Other specified abnormal uterine and vaginal bleeding: Secondary | ICD-10-CM | POA: Diagnosis not present

## 2023-10-21 DIAGNOSIS — D252 Subserosal leiomyoma of uterus: Secondary | ICD-10-CM | POA: Diagnosis not present

## 2023-10-21 DIAGNOSIS — D250 Submucous leiomyoma of uterus: Secondary | ICD-10-CM | POA: Diagnosis not present

## 2023-10-22 ENCOUNTER — Other Ambulatory Visit: Payer: Self-pay

## 2023-10-22 MED ORDER — TIRZEPATIDE 12.5 MG/0.5ML ~~LOC~~ SOAJ: 12.5000 mg | SUBCUTANEOUS | 0 refills | Status: DC

## 2023-11-10 DIAGNOSIS — D509 Iron deficiency anemia, unspecified: Secondary | ICD-10-CM | POA: Diagnosis not present

## 2023-11-17 DIAGNOSIS — D509 Iron deficiency anemia, unspecified: Secondary | ICD-10-CM | POA: Diagnosis not present

## 2023-11-18 ENCOUNTER — Ambulatory Visit
Admission: EM | Admit: 2023-11-18 | Discharge: 2023-11-18 | Disposition: A | Discharge status: Home / Self Care | Attending: Family Medicine | Admitting: Family Medicine

## 2023-11-18 ENCOUNTER — Other Ambulatory Visit: Payer: Self-pay

## 2023-11-18 DIAGNOSIS — N3001 Acute cystitis with hematuria: Secondary | ICD-10-CM | POA: Diagnosis not present

## 2023-11-18 LAB — POCT URINALYSIS DIP (MANUAL ENTRY)
Bilirubin, UA: NEGATIVE
Glucose, UA: NEGATIVE mg/dL
Ketones, POC UA: NEGATIVE mg/dL
Nitrite, UA: NEGATIVE
Protein Ur, POC: NEGATIVE mg/dL
Spec Grav, UA: 1.025 (ref 1.010–1.025)
Urobilinogen, UA: 0.2 U/dL
pH, UA: 6 (ref 5.0–8.0)

## 2023-11-18 LAB — POCT URINE PREGNANCY: Preg Test, Ur: NEGATIVE

## 2023-11-18 MED ORDER — CEPHALEXIN 500 MG PO CAPS: 500.0000 mg | ORAL_CAPSULE | Freq: Four times a day (QID) | ORAL | 0 refills | Status: AC

## 2023-11-18 MED ORDER — FLUCONAZOLE 150 MG PO TABS: 150.0000 mg | ORAL_TABLET | Freq: Every day | ORAL | 0 refills | Status: AC

## 2023-11-18 NOTE — ED Provider Notes (Signed)
 [facility]    CSN: [id number] Arrival date & time: 11/18/23  1753      History   Chief Complaint Chief Complaint  Patient presents with   Dysuria   Urinary Frequency    HPI Nancy Nunez is a 43 y.o. female presents for dysuria.  Patient reports 2 weeks of intermittent urinary burning with frequency and low back pain.  Denies urgency, hematuria, fevers, nausea/vomiting.  No vaginal discharge or STD concern.  Has a history of recurrent UTIs.  Did take Azo last week but no recent OTC treatments have been used for symptoms.  No other concerns at this time.   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   Obesity 05/20/2023   PFO (patent foramen ovale) 05/19/2023   Tachycardia 12/23/2022   TIA (transient ischemic attack) 05/19/2023   Type 2 diabetes mellitus with hyperglycemia (HCC) 01/04/2019    Patient Active Problem List   Diagnosis Date Noted   Annual physical exam 10/13/2023   History of TIA (transient ischemic attack) 10/13/2023   GAD (generalized anxiety disorder) 10/13/2023   Seasonal allergic rhinitis due to pollen 10/13/2023   Mild intermittent asthma without complication 10/13/2023   Uterine leiomyoma 10/13/2023   BMI 30.0-30.9,adult 10/13/2023   Anemia    Family history of adverse reaction to anesthesia    GERD (gastroesophageal reflux disease)    Obesity 05/20/2023   Menorrhagia 05/20/2023   PFO (patent foramen ovale)  05/19/2023   Myalgia 05/19/2023   Allergies 12/23/2022   Tachycardia 12/23/2022   Inadequately controlled diabetes mellitus (HCC) 04/19/2022   Iron deficiency anemia 04/19/2022   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      Home Medications    Prior to Admission medications   Medication Sig Start Date End Date Taking? Authorizing Provider  cephALEXin (KEFLEX) 500 MG capsule Take 1 capsule (500 mg total) by mouth 4 (four) times daily for 7 days. 11/18/23 11/25/23 Yes Shepard Keltz, Jodi R, NP  fluconazole  (DIFLUCAN ) 150 MG tablet Take 1 tablet (150 mg total) by mouth daily for 2 doses. 11/18/23 11/20/23 Yes Alleen Arbour, NP  AIRSUPRA  90-80 MCG/ACT AERO Inhale 2 puffs into the lungs as needed (every 6 hours for cough, wheeze, shortness of breath.  Rinse, gargle, and spit after use). 05/05/23   Kozlow, Rema Care, MD  ALPRAZolam  (  XANAX ) 0.25 MG tablet Take 1 tablet (0.25 mg total) by mouth at bedtime as needed for anxiety. 05/05/23   Wayne Haines, MD  Ascorbic Acid (VITAMIN C) 1000 MG tablet Take 1,000 mg by mouth daily.    [provider]  aspirin  EC 81 MG tablet Take 1 tablet (81 mg total) by mouth daily. Swallow whole. 05/20/23   Hassan Links, MD  Azelastine -Fluticasone  137-50 MCG/ACT SUSP Can use one to two sprays in each nostril one to two times daily. 07/28/23   Kozlow, Rema Care, MD   cetirizine (ZYRTEC) 10 MG tablet Take 10 mg by mouth daily.    [provider]  EPINEPHrine  (EPIPEN  2-PAK) 0.3 mg/0.3 mL IJ SOAJ injection Inject 0.3 mg into the muscle as needed for anaphylaxis.    [provider]  famotidine  (PEPCID ) 40 MG tablet Take one tablet by mouth every night. 05/05/23   Kozlow, Rema Care, MD  Ferric Maltol  (ACCRUFER ) 30 MG CAPS Take 1 capsule (30 mg total) by mouth in the morning and at bedtime. 03/31/23   Wayne Haines, MD  Insulin  Pen Needle (BD PEN NEEDLE NANO 2ND GEN) 32G X 4 MM MISC 1 each by Other route daily. 01/04/20   [provider]  LEVEMIR FLEXPEN 100 UNIT/ML FlexPen Inject 46 Units into the skin at bedtime. 01/25/22   [provider]  Multiple Vitamin (MULTIVITAMIN WITH MINERALS) TABS tablet Take 1 tablet by mouth daily.    [provider]  Nutritional Supplements (NUTRITIONAL SUPPLEMENT PO) Take by mouth at bedtime. Ashwaganda    [provider]  omeprazole  (PRILOSEC) 40 MG capsule Take 1 capsule (40 mg total) by mouth in the morning. 05/05/23   Kozlow, Rema Care, MD  ondansetron  (ZOFRAN ) 4 MG tablet Take 1 tablet (4 mg total) by mouth daily as needed for nausea or vomiting. 10/15/23 10/14/24  Wayne Haines, MD  tirzepatide [facility]) 12.5 MG/0.5ML Pen Inject 12.5 mg into the skin once a week. 10/22/23   Wayne Haines, MD  triamcinolone  cream (KENALOG ) 0.1 % Apply 1 Application topically 2 (two) times daily. 02/14/23   Wayne Haines, MD  Vitamin D, Cholecalciferol, 25 MCG (1000 UT) TABS Take 1,000 Units by mouth daily.    [provider]  Zinc  50 MG TABS Take 50 mg by mouth daily.    [provider]    Family History Family History  Problem Relation Age of Onset   Diabetes Mother    High Cholesterol Mother    Heart disease Mother    Epilepsy Mother    High Cholesterol Father    Diabetes Father    Hypertension Father    Throat cancer Maternal Grandfather     Social History Social  History   Tobacco Use   Smoking status: Never   Smokeless tobacco: Never  Vaping Use   Vaping status: Never Used  Substance Use Topics   Alcohol use: Yes    Comment: occas   Drug use: Never     Allergies   Bee venom, Peanut-containing drug products, Pineapple, Latex, Tramadol, Macadamia nut oil, and Trazodone   Review of Systems Review of Systems  Genitourinary:  Positive for dysuria and frequency.     Physical Exam Triage Vital Signs ED Triage Vitals  Encounter Vitals Group     BP 11/18/23 1811 136/88     Systolic BP Percentile --      Diastolic BP Percentile --      Pulse Rate 11/18/23 1811  93     Resp 11/18/23 1811 17     Temp 11/18/23 1811 98.7 F (37.1 C)     Temp Source 11/18/23 1811 Oral     SpO2 11/18/23 1811 97 %     Weight --      Height --      Head Circumference --      Peak Flow --      Pain Score 11/18/23 1809 5     Pain Loc --      Pain Education --      Exclude from Growth Chart --    No data found.  Updated Vital Signs BP 136/88   Pulse 93   Temp 98.7 F (37.1 C) (Oral)   Resp 17   LMP 11/12/2023   SpO2 97%   Visual Acuity Right Eye Distance:   Left Eye Distance:   Bilateral Distance:    Right Eye Near:   Left Eye Near:    Bilateral Near:     Physical Exam Vitals and nursing note reviewed.  Constitutional:      Appearance: Normal appearance.  HENT:     Head: Normocephalic and atraumatic.  Eyes:     Pupils: Pupils are equal, round, and reactive to light.  Cardiovascular:     Rate and Rhythm: Normal rate.  Pulmonary:     Effort: Pulmonary effort is normal.  Abdominal:     Tenderness: There is no right CVA tenderness or left CVA tenderness.  Musculoskeletal:     Lumbar back: Tenderness present. No spasms or bony tenderness.       Back:  Skin:    General: Skin is warm and dry.  Neurological:     General: No focal deficit present.     Mental Status: She is alert and oriented to person, place, and time.  Psychiatric:         Mood and Affect: Mood normal.        Behavior: Behavior normal.      UC Treatments / Results  Labs (all labs ordered are listed, but only abnormal results are displayed) Labs Reviewed  POCT URINALYSIS DIP (MANUAL ENTRY) - Abnormal; Notable for the following components:      Result Value   Clarity, UA cloudy (*)    Blood, UA trace-intact (*)    Leukocytes, UA Small (1+) (*)    All other components within normal limits  URINE CULTURE  POCT URINE PREGNANCY    EKG   Radiology No results found.  Procedures Procedures (including critical care time)  Medications Ordered in UC Medications - No data to display  Initial Impression / Assessment and Plan / UC Course  I have reviewed the triage vital signs and the nursing notes.  Pertinent labs & imaging results that were available during my care of the patient were reviewed by me and considered in my medical decision making (see chart for details).     Reviewed exam and symptoms with patient.  No red flags.  Urine positive for UTI, will culture and start Keflex.  Patient requested Diflucan  for antibiotic induced yeast infections.  Advise rest fluids, PCP follow-up in 2 to 3 days for recheck.  ER precautions reviewed and patient verbalized understanding. Final Clinical Impressions(s) / UC Diagnoses   Final diagnoses:  Acute cystitis with hematuria     Discharge Instructions      The clinic will send her urine for culture and contact you with results if it is positive.  Start  Keflex 4 times a day for 7 days.  Diflucan  to help prevent a antibiotic induced yeast infection.  Lots of rest and fluids.  Please follow-up with your PCP in 2 to 3 days for recheck.  Please go to the ER for any worsening symptoms.  Hope you feel better soon!  ED Prescriptions     Medication Sig Dispense Auth. Provider   cephALEXin (KEFLEX) 500 MG capsule Take 1 capsule (500 mg total) by mouth 4 (four) times daily for 7 days. 28 capsule Evalise Abruzzese,  Jodi R, NP   fluconazole  (DIFLUCAN ) 150 MG tablet Take 1 tablet (150 mg total) by mouth daily for 2 doses. 2 tablet Allyce Bochicchio, Jodi R, NP      PDMP not reviewed this encounter.   Alleen Arbour, NP 11/18/23 [phone]

## 2023-11-18 NOTE — Discharge Instructions (Addendum)
 The clinic will send her urine for culture and contact you with results if it is positive.  Start Keflex 4 times a day for 7 days.  Diflucan  to help prevent a antibiotic induced yeast infection.  Lots of rest and fluids.  Please follow-up with your PCP in 2 to 3 days for recheck.  Please go to the ER for any worsening symptoms.  Hope you feel better soon!

## 2023-11-18 NOTE — ED Triage Notes (Signed)
 Pt c/o dysuria, urinary frequent lower flank pain [id number]

## 2023-11-19 ENCOUNTER — Ambulatory Visit: Payer: Self-pay

## 2023-11-19 LAB — URINE CULTURE: Culture: <10000 — AB

## 2023-11-20 ENCOUNTER — Ambulatory Visit (HOSPITAL_COMMUNITY): Payer: Self-pay

## 2023-11-26 DIAGNOSIS — H524 Presbyopia: Secondary | ICD-10-CM | POA: Diagnosis not present

## 2023-12-01 ENCOUNTER — Other Ambulatory Visit: Payer: Self-pay | Admitting: Internal Medicine

## 2023-12-02 ENCOUNTER — Other Ambulatory Visit: Payer: Self-pay

## 2023-12-02 MED ORDER — TIRZEPATIDE 15 MG/0.5ML ~~LOC~~ SOAJ: 15.0000 mg | SUBCUTANEOUS | 5 refills | Status: DC

## 2023-12-02 NOTE — Progress Notes (Signed)
Rx dose change

## 2024-01-12 ENCOUNTER — Ambulatory Visit: Admitting: Internal Medicine

## 2024-01-19 ENCOUNTER — Encounter: Payer: Self-pay | Admitting: Internal Medicine

## 2024-01-19 ENCOUNTER — Other Ambulatory Visit: Payer: Self-pay | Admitting: Internal Medicine

## 2024-01-19 ENCOUNTER — Ambulatory Visit: Admitting: Internal Medicine

## 2024-01-19 VITALS — BP 134/82 | HR 104 | Temp 97.7°F | Resp 18 | Ht 64.5 in | Wt 165.0 lb

## 2024-01-19 DIAGNOSIS — E1165 Type 2 diabetes mellitus with hyperglycemia: Secondary | ICD-10-CM | POA: Diagnosis not present

## 2024-01-19 DIAGNOSIS — D649 Anemia, unspecified: Secondary | ICD-10-CM | POA: Diagnosis not present

## 2024-01-19 MED ORDER — LEVEMIR FLEXPEN 100 UNIT/ML ~~LOC~~ SOPN: 46.0000 [IU] | PEN_INJECTOR | Freq: Every day | SUBCUTANEOUS | 3 refills | Status: DC

## 2024-01-19 MED ORDER — MOUNJARO 12.5 MG/0.5ML ~~LOC~~ SOAJ: 12.5000 mg | SUBCUTANEOUS | 5 refills | Status: AC

## 2024-01-19 NOTE — Assessment & Plan Note (Signed)
 We will check her HgBA1c today.  I want her to cut the mounjaro  down to 12.5mg  weekly and hopefully this will help with nausea.

## 2024-01-19 NOTE — Progress Notes (Signed)
 Office Visit  Subjective   Patient ID: Nancy Nunez   DOB: Jan 12, 1981   Age: 43 y.o.   MRN: [id number]   Chief Complaint Chief Complaint  Patient presents with   Follow-up    3 month follow up     History of Present Illness The patient is a 43 year old African American/Black female who returns for a follow-up visit for her T2 diabetes.  On her last visit, her A1c was elevated and we uptitrate her mounjaro  from 10mg  weekly to 15mg  weekly.  We also increased her lantus  to 35 Units BID (she was on lantus  70 units daily before).  Her mounjaro  15mg  is causing nausea.  her insurance quit covering Januvia.  She was diagnosed with T2 diabetes in 2014 when she was 43 years old. She used to see endocrinology but has not seen them for years.  She currently on Lantus  35 Units BID and mounjaro  15mg  subcut weekly . She is also supposed to be on  novolog 10 Units daily with each meal but she states she eats several small meals during the day and does not really use the short acting.  She states she gets nausea on her first injection of the week of her mounjaro .  She specifically denies unexplained abdominal pain,  vomiting, diarrhea and documented hypoglycemia. She checks blood sugars once day and they tend to range somewhere between 170 and 200 mg/dl. She came in fasting today in anticipation of lab work. Her last HgbA1c was done in 3 months ago and was 8.9%.   I also started her on kerendia this past year but her insurance would not cover it and she does not have protein in her urine.  I also started her on low dose Ramipril for her diabetes.  There is no long term complications of diabetic retinopathy, neuropathy, nephropathy or cardiovascular disease.  Her last dilated diabetic eye exam was done on 11/23/2023 and this showed a new mild left diabetic retinopathy that they are following.    The patient is also followed for anemia due to her history of uterine fibroids.  She has seen her GYN and they are  talking about doing a Sonata treatment for her fibroids.  Her last HgB was 11.4 in 10/2023 but her ferritin level was 11.  We therefore set her up for ferreheme injections which she has had 2 since her last visit.  She is also on accufer 30mg  BID but ran out about a month ago.      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  Peanuts AND MACADAMIA NUTS; PT STATES IT FEELS LIKE SHE HAS A KNOT IN HER THROAT   Pineapple Anaphylaxis and Hives   Latex Hives, Itching, Swelling and Other (See Comments)   Tramadol Other (See Comments)   Macadamia Nut Oil Other (See Comments)    Test positive on allergen test    Trazodone Other (See Comments)     Medications  Current Outpatient Medications:    AIRSUPRA  90-80 MCG/ACT AERO, Inhale 2 puffs into the lungs as needed (every 6 hours for cough, wheeze, shortness of breath.  Rinse, gargle, and spit after use)., Disp: 10.7 g, Rfl: 1   ALPRAZolam  (XANAX ) 0.25 MG tablet, Take 1 tablet (0.25 mg total) by mouth at bedtime as  needed for anxiety., Disp: 30 tablet, Rfl: 2   Ascorbic Acid (VITAMIN C) 1000 MG tablet, Take 1,000 mg by mouth daily., Disp: , Rfl:    aspirin  EC 81 MG tablet, Take 1 tablet (81 mg total) by mouth daily. Swallow whole., Disp: 90 tablet, Rfl: 3   Azelastine -Fluticasone  137-50 MCG/ACT SUSP, Can use one to two sprays in each nostril one to two times daily., Disp: 23 g, Rfl: 5   cetirizine (ZYRTEC) 10 MG tablet, Take 10 mg by mouth daily., Disp: , Rfl:    EPINEPHrine  (EPIPEN  2-PAK) 0.3 mg/0.3 mL IJ SOAJ injection, Inject 0.3 mg into the muscle as needed for anaphylaxis., Disp: , Rfl:    famotidine  (PEPCID ) 40 MG tablet, Take one tablet by mouth every night., Disp: 30 tablet, Rfl: 5   Ferric Maltol  (ACCRUFER ) 30 MG CAPS, Take 1 capsule (30 mg total) by mouth in the morning and at bedtime., Disp: 180 capsule, Rfl: 1   Insulin  Pen Needle (BD PEN NEEDLE NANO 2ND GEN) 32G X 4 MM MISC, 1 each by Other route daily., Disp: , Rfl:    LEVEMIR  FLEXPEN 100 UNIT/ML FlexPen, Inject 46 Units into the skin at bedtime., Disp: , Rfl:    Multiple Vitamin (MULTIVITAMIN WITH MINERALS) TABS tablet, Take 1 tablet by mouth daily., Disp: , Rfl:    Nutritional Supplements (NUTRITIONAL SUPPLEMENT PO), Take by mouth at bedtime. Ashwaganda, Disp: , Rfl:    omeprazole  (PRILOSEC) 40 MG capsule, Take 1 capsule (40 mg total) by mouth in the morning., Disp: 30 capsule, Rfl: 5   ondansetron  (ZOFRAN ) 4 MG tablet, Take 1 tablet (4 mg total) by mouth daily as needed for nausea or vomiting., Disp: 30 tablet, Rfl: 1   triamcinolone  cream (KENALOG ) 0.1 %, Apply 1 Application topically 2 (two) times daily., Disp: 30 g, Rfl: 5   Vitamin D, Cholecalciferol, 25 MCG (1000 UT) TABS, Take 1,000 Units by mouth daily., Disp: , Rfl:    Zinc  50 MG TABS, Take 50 mg by mouth daily., Disp: , Rfl:    Review of Systems Review of Systems  Constitutional:  Negative for chills and fever.  Eyes:  Negative for blurred vision.  Respiratory:  Negative for  shortness of breath.   Cardiovascular:  Negative for chest pain and palpitations.  Gastrointestinal:  Positive for constipation and diarrhea. Negative for abdominal pain, nausea and vomiting.  Genitourinary:  Negative for frequency.  Neurological:  Positive for dizziness. Negative for weakness and headaches.  Endo/Heme/Allergies:  Negative for polydipsia.       Objective:    Vitals BP 134/82   Pulse (!) 104   Temp 97.7 F (36.5 C)   Resp 18   Ht 5' 4.5 (1.638 m)   Wt 165 lb (74.8  kg)   SpO2 98%   BMI 27.88 kg/m    Physical Examination Physical Exam Constitutional:      Appearance: Normal appearance. She is not ill-appearing.  Cardiovascular:     Rate and Rhythm: Normal rate and regular rhythm.     Pulses: Normal pulses.     Heart sounds: No murmur heard.    No friction rub. No gallop.  Pulmonary:     Effort: Pulmonary effort is normal. No respiratory distress.     Breath sounds: No wheezing, rhonchi or rales.  Abdominal:     General: Bowel sounds are normal. There is no distension.     Palpations: Abdomen is soft.     Tenderness: There is no abdominal tenderness.  Musculoskeletal:     Right lower leg: No edema.     Left lower leg: No edema.  Skin:    General: Skin is warm and dry.     Findings: No rash.  Neurological:     Mental Status: She is alert.        Assessment & Plan:   Inadequately controlled diabetes mellitus (HCC) We will check her HgBA1c today.  I want her to cut the mounjaro  down to 12.5mg  weekly and hopefully this will help with nausea.  Anemia She is s/p ferreheme infusion.  She cannot get the sonata done until we have her HgBA1c a bit better controlled.  We will obtain a CBC and ferritin/iron levels today.    Return in about 3 months (around 04/20/2024).   Selinda Fleeta Finger, MD

## 2024-01-19 NOTE — Assessment & Plan Note (Signed)
 She is s/p ferreheme infusion.  She cannot get the sonata done until we have her HgBA1c a bit better controlled.  We will obtain a CBC and ferritin/iron levels today.

## 2024-01-20 ENCOUNTER — Ambulatory Visit: Admitting: Internal Medicine

## 2024-01-20 DIAGNOSIS — D649 Anemia, unspecified: Secondary | ICD-10-CM | POA: Diagnosis not present

## 2024-01-20 DIAGNOSIS — E1165 Type 2 diabetes mellitus with hyperglycemia: Secondary | ICD-10-CM

## 2024-01-20 NOTE — Progress Notes (Unsigned)
 Nurse visit Lab draw CBC, A1C, Ferritin, and Iron

## 2024-01-21 LAB — CBC WITH DIFFERENTIAL/PLATELET
Basophils Absolute: 0 x10E3/uL (ref 0.0–0.2)
Basos: 1 %
EOS (ABSOLUTE): 0.1 x10E3/uL (ref 0.0–0.4)
Eos: 1 %
Hematocrit: 41.1 % (ref 34.0–46.6)
Hemoglobin: 13.1 g/dL (ref 11.1–15.9)
Immature Grans (Abs): 0 x10E3/uL (ref 0.0–0.1)
Immature Granulocytes: 0 %
Lymphocytes Absolute: 2.4 x10E3/uL (ref 0.7–3.1)
Lymphs: 38 %
MCH: 29 pg (ref 26.6–33.0)
MCHC: 31.9 g/dL (ref 31.5–35.7)
MCV: 91 fL (ref 79–97)
Monocytes Absolute: 0.3 x10E3/uL (ref 0.1–0.9)
Monocytes: 5 %
Neutrophils Absolute: 3.5 x10E3/uL (ref 1.4–7.0)
Neutrophils: 55 %
Platelets: 327 x10E3/uL (ref 150–450)
RBC: 4.51 x10E6/uL (ref 3.77–5.28)
RDW: 15.2 % (ref 11.7–15.4)
WBC: 6.2 x10E3/uL (ref 3.4–10.8)

## 2024-01-21 LAB — HEMOGLOBIN A1C
Est. average glucose Bld gHb Est-mCnc: 140 mg/dL
Hgb A1c MFr Bld: 6.5 % — ABNORMAL HIGH (ref 4.8–5.6)

## 2024-01-21 LAB — IRON: Iron: 72 ug/dL (ref 27–159)

## 2024-01-21 LAB — FERRITIN: Ferritin: 112 ng/mL (ref 15–150)

## 2024-01-26 ENCOUNTER — Ambulatory Visit: Payer: Federal, State, Local not specified - PPO | Admitting: Allergy and Immunology

## 2024-01-28 ENCOUNTER — Ambulatory Visit: Payer: Self-pay

## 2024-01-28 NOTE — Progress Notes (Signed)
 NEUROLOGY FOLLOW UP OFFICE NOTE  Nancy Nunez [id number]  Subjective:  Nancy Nunez is a 43 y.o. year old right-handed female with a medical history of DM, asthma, anxiety, GERD who we last saw on 05/22/23 for transient left arm pain and weakness.  To briefly review: 05/22/23: On 05/16/23, patient had a sudden onset pain and heaviness and tingling sensation in left arm. She could not move her arm either. She was sent across the street to the ED. CTH showed no acute process. CTA head and neck showed no LVO or significant stenosis. MRI brain (under general anesthesia) on 05/17/23 that showed no acute abnormality. Echo showed normal LV function and possible PFO. Patient is now seeing cardiology (05/20/23). Per patient, cardiology thinks symptoms are related to this and not neurologic. She is scheduled to get a TEE soon.    She is back to normal now. Her heaviness and pain lasted about 45 minutes. The tingling lasted about 24 hours. Her weakness improved later that night.   While in the hospital, patient was started on aspirin  81 mg and Plavix 75 mg daily, with a plan for 21 days. She only took plavix for 3 days though because PCP and cardiologist did not think this was needed. She is also on lovastatin  10 mg daily. She is taking aspirin  81 mg daily.   She has had no further symptoms since that day. She does have some muscle aches all over from taking Lipitor. This is improving.   The patient has not had similar episodes of symptoms in the past.     She report any constitutional symptoms like fever, night sweats, anorexia or unintentional weight loss.   EtOH use: 1-2 drinks, 1-2 times per month Non-smoker  Restrictive diet? No Family history of neuropathy/myopathy/neurologic disease? Mother with epilepsy, grandmother with neuropathy, mother also has diabetic neuropathy  Most recent Assessment and Plan (05/22/23): Nancy Nunez is a 43 y.o. female who presents for evaluation of  transient left arm pain and weakness resolving completely in about 24 hours. She has a relevant medical history of DM, asthma, anxiety, GERD. Her neurological examination is normal today. Available diagnostic data is significant for MRI brain showing no stroke, CTA head and neck showing no occlusion or significant stenosis, and echocardiogram normal except possible PFO. The etiology of patient's symptoms is unclear, but the most concerning possibility is TIA. While pain in unusual in TIA, her symptoms did seem to resolve in less than 24 hours with a normal MRI brain, which meets criteria. There does not appear to be a peripheral source of symptoms, nor would this be expected to resolve so quickly. After discussion, we agreed to treat as TIA as the safest course of action.   PLAN: -Blood work: TSH -Continue aspirin  81 mg daily -Continue lovastatin  10 mg daily -Stroke precautions discusssed -Follow up with cardiology as planned  Since their last visit: She is having headaches since being in the hospital (05/2023). It is in the left posterior head. It is worse with stress. It is from the shoulder, into the neck, and back of head. There can be associated left arm numbness. The pain is a muscle tightness and squeezing sensation. The headache will last hours. Over the last couple of months, she is having 2 headaches per month. Stress is what triggers her headaches. She denies photophobia, phonophobia, or nausea.  Previously she would get occasional sinus headache.   Patient was told to stop aspirin  and statin by cardiologist per her report because  he didn't think she had a TIA but was having migraines.  MEDICATIONS:  Outpatient Encounter Medications as of 02/06/2024  Medication Sig   AIRSUPRA  90-80 MCG/ACT AERO Inhale 2 puffs into the lungs as needed (every 6 hours for cough, wheeze, shortness of breath.  Rinse, gargle, and spit after use).   ALPRAZolam  (XANAX ) 0.25 MG tablet Take 1 tablet (0.25 mg  total) by mouth at bedtime as needed for anxiety.   Ascorbic Acid (VITAMIN C) 1000 MG tablet Take 1,000 mg by mouth daily.   Azelastine -Fluticasone  137-50 MCG/ACT SUSP Can use one to two sprays in each nostril one to two times daily.   cetirizine (ZYRTEC) 10 MG tablet Take 10 mg by mouth daily.   clindamycin (CLEOCIN) 300 MG capsule Take 300 mg by mouth 2 (two) times daily.   cyclobenzaprine  (FLEXERIL ) 5 MG tablet Take 1-2 tablets (5-10 mg total) by mouth every 8 (eight) hours as needed for muscle spasms.   EPINEPHrine  (EPIPEN  2-PAK) 0.3 mg/0.3 mL IJ SOAJ injection Inject 0.3 mg into the muscle as needed for anaphylaxis.   famotidine  (PEPCID ) 40 MG tablet Take one tablet by mouth every night.   Insulin  Glargine (BASAGLAR  KWIKPEN) 100 UNIT/ML Please specify directions, refills and quantity   Insulin  Pen Needle (BD PEN NEEDLE NANO 2ND GEN) 32G X 4 MM MISC 1 each by Other route daily.   megestrol (MEGACE) 40 MG tablet Take 40 mg by mouth daily.   Multiple Vitamin (MULTIVITAMIN WITH MINERALS) TABS tablet Take 1 tablet by mouth daily.   Nutritional Supplements (NUTRITIONAL SUPPLEMENT PO) Take by mouth at bedtime. Ashwaganda   omeprazole  (PRILOSEC) 40 MG capsule Take 1 capsule (40 mg total) by mouth in the morning.   ondansetron  (ZOFRAN ) 4 MG tablet Take 1 tablet (4 mg total) by mouth daily as needed for nausea or vomiting.   tirzepatide  (MOUNJARO ) 12.5 MG/0.5ML Pen Inject 12.5 mg into the skin once a week.   triamcinolone  cream (KENALOG ) 0.1 % Apply 1 Application topically 2 (two) times daily. (Patient taking differently: Apply 1 Application topically as needed.)   Vitamin D, Cholecalciferol, 25 MCG (1000 UT) TABS Take 1,000 Units by mouth daily.   Zinc  50 MG TABS Take 50 mg by mouth daily.   aspirin  EC 81 MG tablet Take 1 tablet (81 mg total) by mouth daily. Swallow whole. (Patient not taking: Reported on 02/06/2024)   Ferric Maltol  (ACCRUFER ) 30 MG CAPS Take 1 capsule (30 mg total) by mouth in the  morning and at bedtime. (Patient not taking: Reported on 02/06/2024)   No facility-administered encounter medications on file as of 02/06/2024.    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   Obesity 05/20/2023   PFO (patent foramen ovale) 05/19/2023   Tachycardia 12/23/2022   TIA (transient ischemic attack) 05/19/2023   Type 2 diabetes mellitus with hyperglycemia (HCC) 01/04/2019    PAST SURGICAL HISTORY: Past Surgical History:  Procedure Laterality Date   CHOLECYSTECTOMY     ESOPHAGOGASTRODUODENOSCOPY  x3   GYNECOLOGIC CRYOSURGERY     PAROTIDECTOMY Left 02/13/2022   Procedure: PAROTIDECTOMY;  Surgeon: Edda Mt, MD;  Location: [facility] ORS;  Service: ENT;  Laterality: Left;   SUPERFICIAL LYMPH NODE BIOPSY / EXCISION     TRANSESOPHAGEAL ECHOCARDIOGRAM (CATH LAB) N/A 06/02/2023   Procedure: TRANSESOPHAGEAL ECHOCARDIOGRAM;  Surgeon: Jeffrie Oneil BROCKS, MD;  Location: [facility];  Service: Cardiovascular;  Laterality: N/A;    ALLERGIES: Allergies  Allergen Reactions   Bee Venom Anaphylaxis   Peanut-Containing Drug Products Anaphylaxis, Other (See Comments) and Swelling    Pt states she felt like something was in her throat after eating peanuts.  Peanuts AND MACADAMIA NUTS; PT STATES IT FEELS LIKE SHE HAS A KNOT IN HER THROAT   Pineapple Anaphylaxis and Hives   Latex Hives, Itching, Swelling and Other (See Comments)   Tramadol Other (See Comments)   Macadamia  Nut Oil Other (See Comments)    Test positive on allergen test    Trazodone Other (See Comments)    FAMILY HISTORY: Family History  Problem Relation Age of Onset   Diabetes Mother    High Cholesterol Mother    Heart disease Mother    Epilepsy Mother    High Cholesterol Father    Diabetes Father    Hypertension Father    Throat cancer Maternal Grandfather     SOCIAL HISTORY: Social History   Tobacco Use   Smoking status: Never   Smokeless tobacco: Never  Vaping Use   Vaping status: Never Used  Substance Use Topics   Alcohol use: Yes    Comment: occas   Drug use: Never   Social History   Social History Narrative   Are you right handed or left handed? Right   Are you currently employed ?    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    What type of home do you live in: 1 story or 2 story? one    Caffiene 1 cup daily      Objective:  Vital Signs:  Ht 5' 4.5 (1.638 m)   Wt 163 lb (73.9 kg)   BMI 27.55 kg/m   General: No acute distress.  Patient appears well-groomed.   Head:  Normocephalic/atraumatic Eyes:  fundi examined, disc margins clear, no obvious papilledema Neck: supple, positive for right paraspinal tenderness, decreased range of motion Lungs: Non-labored breathing on room air   Neurological Exam: Mental status: alert and oriented, speech fluent and not dysarthric, language intact.  Cranial nerves: CN I: not tested CN II: pupils equal, round and reactive to light, visual fields intact CN III, IV, VI:  full range of motion, no nystagmus, no ptosis CN V: facial sensation intact. CN VII: upper and lower face symmetric CN VIII: hearing intact CN IX, X: uvula midline CN XI: sternocleidomastoid and trapezius muscles intact CN XII: tongue midline  Bulk & Tone: normal, no fasciculations. Motor:  muscle strength 5/5 throughout Deep Tendon Reflexes:  2+ throughout, except trace at bilateral ankles.    Sensation:  Light touch sensation intact. Finger to nose testing:  Without dysmetria.   Gait:  Normal station and stride.  Romberg negative.   Labs and Imaging review: New results: 01/20/24: HbA1c: 6.5 CBC unremarkable  Ferritin (10/13/23): 11 Lipid panel (10/13/23): tChol 161, LDL 80, TG 63  HbA1c (07/07/23): 8.5 TSH (05/22/23): wnl   Previously reviewed results: 05/19/23: CK: 142   Celiac panel (  05/05/23): negative   External labs: 05/18/23: CBC unremarkable BMP unremarkable   Lipid panel (05/17/23): tChol 151, LDL 75, TG 88 HbA1c (05/16/23): 8.6   External Imaging: MRI Head WO Contrast (05/17/23): No stroke or concerning pathology per my read.   Per radiology: Negative for acute infarct.  No acute intracranial hemorrhage identified.  No midline shift or acute mass effect.  No abnormal extra-axial fluid collection identified.  Ventricles, cisterns, and sulci are unremarkable.  Negative for hydrocephalus.  Major basilar intracranial flow voids are patent.  Orbits appear unremarkable.  Paranasal sinuses are clear.  Mild left mastoid effusion.  No suspicious osseous lesions identified.  IMPRESSION:  No acute intracranial abnormality identified.    CTA head and neck (05/16/23): No LVO or significant stenosis per my read. Per radiology: CT Code Stroke Head Neck Angio  Final Result  IMPRESSION:  1. No occlusion or significant stenosis.    Echocardiogram (05/16/23): Echocardiogram Complete WO Enhancing Agent  Final Result  Left Ventricle: Left ventricle size is normal.  Left Ventricle: Systolic function is normal. EF: 60-65%. Quantitative  analysis of left ventricular Global Longitudinal Strain (GLS) imaging is  -22.700%. Ejection fraction measured by 3D is 60%,  Left Atrium: Injection of agitated saline documents few late bubbles on  left side suggestive of small PFO or pulmonary AVM.  Right Ventricle: Right ventricle size is normal. Systolic function is  normal.     MRI cervical spine (11/07/20): FINDINGS: Intermittently motion degraded examination. Most notably, there is mild motion degradation of the sagittal STIR sequence, and moderate motion degradation of the axial T2 TSE sequence.   Alignment: Reversal of the expected cervical lordosis. No significant spondylolisthesis.   Vertebrae: Vertebral body height is maintained. No significant marrow edema or focal suspicious osseous lesion.   Cord: Within the limitations of motion degradation, no spinal cord signal abnormality is identified.   Posterior Fossa, vertebral arteries, paraspinal tissues: No abnormality identified within included portions of the posterior fossa. Flow voids preserved within the imaged cervical vertebral arteries. Paraspinal soft tissues within normal limits.   Disc levels:   No more than mild disc degeneration at any level.   C2-C3: No significant disc herniation or stenosis.   C3-C4: No significant disc herniation or stenosis.   C4-C5: Small central disc protrusion. Mild partial effacement of the ventral thecal sac with possible contact upon the ventral spinal cord. No significant foraminal stenosis.   C5-C6: Small central disc protrusion. Mild partial effacement of the ventral thecal sac with possible contact upon the ventral spinal cord. No significant foraminal stenosis.   C6-C7: Small central disc protrusion. Mild partial effacement of the ventral thecal sac with possible contact upon the ventral spinal cord. No significant foraminal stenosis.   C7-T1: No significant disc herniation or stenosis.   IMPRESSION: Motion degraded examination.   Cervical spondylosis, as outlined and with findings most notably as follows.   There are small central disc protrusions at C4-C5, C5-C6 and C6-C7 with resultant mild relative spinal canal narrowing and possible contact upon the ventral spinal cord.   No significant foraminal stenosis.   No more than mild  disc degeneration within the cervical spine.  Assessment/Plan:  This is Nancy Nunez, a 43 y.o. female with: Episode of transient left arm pain and weakness resolving completely in about 24 hours in 05/2023 originally thought to be TIA. Available diagnostic data is significant for MRI brain showing no stroke, CTA head and neck showing no occlusion or significant stenosis, and echocardiogram normal except  possible PFO. I originally treated as TIA with aspirin  and statin. These have since been stopped. Per patient, cardiology diagnosed her with migraines and not TIA so stopped both. Patient does not meet criteria for migraines. She does have a bleeding fibroid though and there is uncertainty about the diagnosis of TIA, so it is reasonable to hold aspirin  as the risks outweigh the benefits. Left neck and head pain with left arm numbness - intermittent, around 2 times per week, triggered by stress. Patient has very tight neck and pain with palpation of left shoulder and neck. The etiology of these symptoms is likely cervicalgia with cervicogenic headaches.  Plan: -Hold asa due to bleeding fibroid and potential that this is not TIA -Flexeril  5-10 mg q8h PRN for neck tightness -Will consider PT if this does not help -Stroke precautions discussed  Return to clinic as needed  Total time spent reviewing records, interview, history/exam, documentation, and coordination of care on day of encounter:  45 min  Venetia Potters, MD

## 2024-01-28 NOTE — Progress Notes (Signed)
 Patient called.  Patient aware.  I have called and informed the patient  Stop taking iron.  Her diabetes is controlled.  Go for her Sonata treatment.  Do not take accufer. SABRA  Pt aware.

## 2024-02-06 ENCOUNTER — Ambulatory Visit: Admitting: Neurology

## 2024-02-06 ENCOUNTER — Encounter: Payer: Self-pay | Admitting: Neurology

## 2024-02-06 VITALS — Ht 64.5 in | Wt 163.0 lb

## 2024-02-06 DIAGNOSIS — R29818 Other symptoms and signs involving the nervous system: Secondary | ICD-10-CM | POA: Diagnosis not present

## 2024-02-06 DIAGNOSIS — M542 Cervicalgia: Secondary | ICD-10-CM

## 2024-02-06 MED ORDER — CYCLOBENZAPRINE HCL 5 MG PO TABS
5.0000 mg | ORAL_TABLET | Freq: Three times a day (TID) | ORAL | 2 refills | Status: AC | PRN
Start: 2024-02-06 — End: ?

## 2024-02-06 NOTE — Patient Instructions (Signed)
 We will hold on aspirin  due to fibroids and risk of bleeding and due to your symptoms maybe not being due to TIA.  I think your neck, head, and arm pain is from your neck being tight. We will try flexeril  5-10 mg every 8 hours as needed.  We can discuss physical therapy for your neck if this does not help.  If you have new difficulty speaking, face droop, numbness on one side of the body, weakness on one side of the body, or dizziness/imbalance, this could be the sign of a stroke. Don't wait, please call EMS and be evaluated at the nearest emergency room.  Follow up with me as needed.  The physicians and staff at [facility] are committed to providing excellent care. You may receive a survey requesting feedback about your experience at our office. We strive to receive very good responses to the survey questions. If you feel that your experience would prevent you from giving the office a very good  response, please contact our office to try to remedy the situation. We may be reached at [phone]. Thank you for taking the time out of your busy day to complete the survey.  Venetia Potters, MD [facility]

## 2024-02-23 ENCOUNTER — Ambulatory Visit: Admitting: Allergy and Immunology

## 2024-02-23 ENCOUNTER — Encounter: Payer: Self-pay | Admitting: Allergy and Immunology

## 2024-02-23 VITALS — BP 114/78 | HR 106 | Resp 16

## 2024-02-23 DIAGNOSIS — J301 Allergic rhinitis due to pollen: Secondary | ICD-10-CM

## 2024-02-23 DIAGNOSIS — K219 Gastro-esophageal reflux disease without esophagitis: Secondary | ICD-10-CM

## 2024-02-23 DIAGNOSIS — J4530 Mild persistent asthma, uncomplicated: Secondary | ICD-10-CM | POA: Diagnosis not present

## 2024-02-23 DIAGNOSIS — J3089 Other allergic rhinitis: Secondary | ICD-10-CM | POA: Diagnosis not present

## 2024-02-23 DIAGNOSIS — Z91018 Allergy to other foods: Secondary | ICD-10-CM

## 2024-02-23 MED ORDER — OMEPRAZOLE 40 MG PO CPDR: 40.0000 mg | DELAYED_RELEASE_CAPSULE | Freq: Every morning | ORAL | 5 refills | Status: DC

## 2024-02-23 MED ORDER — BECLOMETHASONE DIPROP HFA 40 MCG/ACT IN AERB: 2.0000 | INHALATION_SPRAY | Freq: Every day | RESPIRATORY_TRACT | 5 refills | Status: AC

## 2024-02-23 MED ORDER — FAMOTIDINE 40 MG PO TABS: ORAL_TABLET | ORAL | 5 refills | Status: AC

## 2024-02-23 MED ORDER — EPINEPHRINE 0.3 MG/0.3ML IJ SOAJ: 0.3000 mg | INTRAMUSCULAR | 2 refills | Status: AC | PRN

## 2024-02-23 NOTE — Progress Notes (Unsigned)
 [place] [facility] [place] [facility] [place] [facility] [place] [facility] [place]   Follow-up Note  Referring Provider: Fleeta Valeria Mayo, MD Primary Provider: Fleeta Valeria, Mayo, MD Date of Office Visit: 02/23/2024  Subjective:   Nancy Nunez (DOB: Mar 28, 1981) is a 43 y.o. female who [facility] in re-evaluation of the following:  HPI: Nancy Nunez returns to this clinic in evaluation of asthma, allergic rhinitis, LPR, adverse food reaction directed against pineapple.  I last saw her in this clinic 28 July 2023.  Her asthma has been a little bit more active over the course of the past month or so.  She did use her short acting bronchodilator about 2-3 times per week whereas prior to this interval time she rarely used her short acting bronchodilator.  Most of this activity appears to be related to exercising outdoors.  Her nose is doing very well while using Dymista .  She has not required a systemic steroid or an antibiotic for any type of airway issue.  She remains away from consumption of pineapple.  She has a oral pineapple challenge arranged for 08 March 2024.  Allergies as of 02/23/2024       Reactions   Bee Venom Anaphylaxis   Peanut-containing Drug Products Anaphylaxis, Other (See Comments), Swelling   Pt states she felt like something was in her throat after eating peanuts. Peanuts AND MACADAMIA NUTS; PT STATES IT FEELS LIKE SHE HAS A KNOT IN HER THROAT   Pineapple Anaphylaxis, Hives   Latex Hives, Itching, Swelling, Other (See Comments)   Tramadol Other (See Comments)   Macadamia Nut Oil Other (See Comments)   Test positive on allergen test    Trazodone Other (See Comments)        Medication List    ACCRUFeR  30 MG Caps Generic drug: Ferric Maltol  Take 1 capsule (30 mg total) by mouth in the morning and at bedtime.   Airsupra  90-80 MCG/ACT Aero Generic drug: Albuterol -Budesonide Inhale 2 puffs into the lungs as needed (every 6 hours for  cough, wheeze, shortness of breath.  Rinse, gargle, and spit after use).   ALPRAZolam  0.25 MG tablet Commonly known as: XANAX  Take 1 tablet (0.25 mg total) by mouth at bedtime as needed for anxiety.   aspirin  EC 81 MG tablet Take 1 tablet (81 mg total) by mouth daily. Swallow whole.   Azelastine -Fluticasone  137-50 MCG/ACT Susp Can use one to two sprays in each nostril one to two times daily.   Basaglar  KwikPen 100 UNIT/ML Please specify directions, refills and quantity   BD Pen Needle Nano 2nd Gen 32G X 4 MM Misc Generic drug: Insulin  Pen Needle 1 each by Other route daily.   cetirizine 10 MG tablet Commonly known as: ZYRTEC Take 10 mg by mouth daily.   cyclobenzaprine  5 MG tablet Commonly known as: FLEXERIL  Take 1-2 tablets (5-10 mg total) by mouth every 8 (eight) hours as needed for muscle spasms.   EpiPen  2-Pak 0.3 mg/0.3 mL Soaj injection Generic drug: EPINEPHrine  Inject 0.3 mg into the muscle as needed for anaphylaxis.   famotidine  40 MG tablet Commonly known as: PEPCID  Take one tablet by mouth every night.   megestrol 40 MG tablet Commonly known as: MEGACE Take 40 mg by mouth daily.   Mounjaro  12.5 MG/0.5ML Pen Generic drug: tirzepatide  Inject 12.5 mg into the skin once a week.   multivitamin with minerals Tabs tablet Take 1 tablet by mouth daily.   NUTRITIONAL SUPPLEMENT PO Take by mouth at  bedtime. Ashwaganda   omeprazole  40 MG capsule Commonly known as: PRILOSEC Take 1 capsule (40 mg total) by mouth in the morning.   ondansetron  4 MG tablet Commonly known as: Zofran  Take 1 tablet (4 mg total) by mouth daily as needed for nausea or vomiting.   triamcinolone  cream 0.1 % Commonly known as: KENALOG  Apply 1 Application topically 2 (two) times daily.   vitamin C 1000 MG tablet Take 1,000 mg by mouth daily.   Vitamin D (Cholecalciferol) 25 MCG (1000 UT) Tabs Take 1,000 Units by mouth daily.   Zinc  50 MG Tabs Take 50 mg by mouth daily.    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   Obesity 05/20/2023   PFO (patent foramen ovale) 05/19/2023   Tachycardia 12/23/2022   TIA (transient ischemic attack) 05/19/2023   Type 2 diabetes mellitus with hyperglycemia (HCC) 01/04/2019    Past Surgical History:  Procedure Laterality Date   CHOLECYSTECTOMY     ESOPHAGOGASTRODUODENOSCOPY     x3   GYNECOLOGIC CRYOSURGERY     PAROTIDECTOMY Left 02/13/2022   Procedure: PAROTIDECTOMY;  Surgeon: Edda Mt, MD;  Location: [facility] ORS;  Service: ENT;  Laterality: Left;   SUPERFICIAL LYMPH NODE BIOPSY / EXCISION     TRANSESOPHAGEAL ECHOCARDIOGRAM (CATH LAB) N/A 06/02/2023   Procedure: TRANSESOPHAGEAL ECHOCARDIOGRAM;  Surgeon: Jeffrie Oneil BROCKS, MD;  Location: [facility];  Service: Cardiovascular;  Laterality: N/A;    Review of systems negative except as noted in HPI / PMHx or noted below:  Review of Systems  Constitutional: Negative.   HENT: Negative.    Eyes: Negative.   Respiratory: Negative.    Cardiovascular: Negative.   Gastrointestinal: Negative.   Genitourinary: Negative.   Musculoskeletal: Negative.   Skin: Negative.   Neurological: Negative.   Endo/Heme/Allergies: Negative.   Psychiatric/Behavioral: Negative.       Objective:   Vitals:   02/23/24 1106  BP: 114/78  Pulse: (!) 106  Resp: 16  SpO2: 99%          Physical Exam Constitutional:      Appearance: She is not diaphoretic.  HENT:     Head: Normocephalic.      Right Ear: Tympanic membrane, ear canal and external ear normal.     Left Ear: Tympanic membrane, ear canal and external ear normal.     Nose: Nose normal. No mucosal edema or rhinorrhea.     Mouth/Throat:     Pharynx: Uvula midline. No oropharyngeal exudate.  Eyes:     Conjunctiva/sclera: Conjunctivae normal.  Neck:     Thyroid : No thyromegaly.     Trachea: Trachea normal. No tracheal tenderness or tracheal deviation.  Cardiovascular:     Rate and Rhythm: Normal rate and regular rhythm.     Heart sounds: Normal heart sounds, S1 normal and S2 normal. No murmur heard. Pulmonary:     Effort: No respiratory distress.     Breath sounds: Normal breath sounds. No stridor. No wheezing or rales.  Lymphadenopathy:     Head:     Right side of head: No tonsillar adenopathy.     Left side of head: No tonsillar adenopathy.     Cervical: No cervical adenopathy.  Skin:    Findings: No erythema or rash.     Nails: There is no clubbing.  Neurological:     Mental Status: She is alert.     Diagnostics: Spirometry was performed and demonstrated an FEV1 of 1.74 at 69 % of predicted.  Assessment and Plan:   1. Not well controlled mild persistent asthma   2. Perennial allergic rhinitis   3. Seasonal allergic rhinitis due to pollen   4. Food allergy    5. LPRD (laryngopharyngeal reflux disease)    1. Avoid - dust mite, animals, pollens, alternaria. (Pineapple challenge)   2. Can arrange for in clinic food challenge with pineapple  3. Treat and prevent inflammation of airway:   A. Dymista  - 1-2 sprays each nostril 1-2 times per day   B. Qvar 40 - 2 inhalations 1 time per day (empty lungs)  4. Treat and prevent reflux induced inflammation of airway:   A. Omeprazole  40 mg - 1 tablet in AM  B. Famotidine  40 mg - 1 tablet in PM  C. Minimize caffeine / chocolate consumption  D. Replace throat clearing with swallowing / drinking maneuver  5. If needed:   A. Airsupra  - 2 inhalations every 6  hours   B. Antihistamines  C. Pataday - 2 drop each eye 1 time per day D. Epi-Pen  6. Return to clinic in 6 months or earlier if problem  7. Influenza = Tamiflu. Covid = Paxlovid  Nancy Nunez appears to have a little bit more activity of her asthma and we will start her on a low-dose of inhaled steroid to complement her upper airway anti-inflammatory therapy which includes Dymista  and hopefully this is going to take care of her entire respiratory tract.  See what happens over the course the next several months.  If she fails medical therapy should be a candidate for immunotherapy.  Her reflux appears to be under very good control at this point.  If she misses famotidine  for a few days she definitely develops issues with reflux in her throat so she is continuing on a proton pump inhibitor and an H2 receptor blocker on a consistent basis.  She has a oral pineapple challenge arranged for 08 March 2024 to work through her history of pineapple allergy .  Nancy Denis, MD Allergy  / Immunology [facility]

## 2024-02-23 NOTE — Patient Instructions (Addendum)
  1. Avoid - dust mite, animals, pollens, alternaria. (Pineapple challenge)   2. Can arrange for in clinic food challenge with pineapple  3. Treat and prevent inflammation of airway:   A. Dymista  - 1-2 sprays each nostril 1-2 times per day   B. Qvar 40 - 2 inhalations 1 time per day (empty lungs)  4. Treat and prevent reflux induced inflammation of airway:   A. Omeprazole  40 mg - 1 tablet in AM  B. Famotidine  40 mg - 1 tablet in PM  C. Minimize caffeine / chocolate consumption  D. Replace throat clearing with swallowing / drinking maneuver  5. If needed:   A. Airsupra  - 2 inhalations every 6 hours   B. Antihistamines  C. Pataday - 2 drop each eye 1 time per day D. Epi-Pen  6. Return to clinic in 6 months or earlier if problem  7. Influenza = Tamiflu. Covid = Paxlovid

## 2024-02-24 ENCOUNTER — Encounter: Payer: Self-pay | Admitting: Allergy and Immunology

## 2024-03-08 ENCOUNTER — Ambulatory Visit: Admitting: Allergy and Immunology

## 2024-03-08 VITALS — BP 106/68 | HR 68 | Resp 16 | Ht 64.0 in | Wt 161.6 lb

## 2024-03-08 DIAGNOSIS — Z91018 Allergy to other foods: Secondary | ICD-10-CM

## 2024-03-09 NOTE — Progress Notes (Signed)
 Nancy Nunez returns to this clinic for an oral food challenge with pineapple.  Using an incremental oral food challenge protocol she was fed approximately 5 cm of fresh pineapple over the course of greater than two hours and she did not have ever have any adverse effects secondary to this exposure.

## 2024-04-19 ENCOUNTER — Ambulatory Visit: Admitting: Internal Medicine

## 2024-04-20 ENCOUNTER — Ambulatory Visit: Admitting: Internal Medicine

## 2024-04-20 ENCOUNTER — Encounter: Payer: Self-pay | Admitting: Internal Medicine

## 2024-04-20 VITALS — BP 110/76 | HR 105 | Temp 97.2°F | Resp 16 | Ht 65.5 in | Wt 161.8 lb

## 2024-04-20 DIAGNOSIS — E119 Type 2 diabetes mellitus without complications: Secondary | ICD-10-CM | POA: Diagnosis not present

## 2024-04-20 DIAGNOSIS — R109 Unspecified abdominal pain: Secondary | ICD-10-CM

## 2024-04-20 NOTE — Progress Notes (Signed)
 Office Visit  Subjective   Patient ID: Nancy Nunez   DOB: 24-Jul-1980   Age: 43 y.o.   MRN: [id number]   Chief Complaint Chief Complaint  Patient presents with   Follow-up    3 Month follow up     History of Present Illness The patient is a 43 year old African American/Black female who returns for a follow-up visit for her T2 diabetes.  On her last visit, her A1c was elevated and we uptitrate her mounjaro  from 10mg  weekly to 15mg  weekly.  We also increased her lantus  to 35 Units BID (she was on lantus  70 units daily before).  Her mounjaro  15mg  is causing nausea.  her insurance quit covering Januvia.  She was diagnosed with T2 diabetes in 2014 when she was 44 years old. She used to see endocrinology but has not seen them for years.  She currently on Lantus  35 Units BID and mounjaro  12.5mg  subcut weekly . She is also supposed to be on  novolog 10 Units daily with each meal but she states she eats several small meals during the day and does not really use the short acting.  She states she gets nausea on her first injection of the week of her mounjaro .  She specifically denies unexplained abdominal pain,  vomiting, diarrhea and documented hypoglycemia. She checks blood sugars once day and they tend to range somewhere between 150 and 200 mg/dl. She came in fasting today in anticipation of lab work. Her last HgbA1c was done in 3 months ago and was 6.5%.   I also started her on kerendia this past year but her insurance would not cover it and she does not have protein in her urine.  I also started her on low dose Ramipril for her diabetes.  There is no long term complications of diabetic retinopathy, neuropathy, nephropathy or cardiovascular disease.  Her last dilated diabetic eye exam was done on 11/23/2023 and this showed a new mild left diabetic retinopathy that they are following.       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  Peanuts AND MACADAMIA NUTS; PT STATES IT FEELS LIKE SHE HAS A KNOT IN HER THROAT   Pineapple Anaphylaxis and Hives   Latex Hives, Itching, Swelling and Other (See Comments)   Tramadol Other (See Comments)   Macadamia Nut Oil Other (See Comments)    Test positive on allergen test    Trazodone Other (See Comments)     Medications  Current Outpatient Medications:    AIRSUPRA  90-80 MCG/ACT AERO, Inhale  2 puffs into the lungs as needed (every 6 hours for cough, wheeze, shortness of breath.  Rinse, gargle, and spit after use)., Disp: 10.7 g, Rfl: 1   ALPRAZolam  (XANAX ) 0.25 MG tablet, Take 1 tablet (0.25 mg total) by mouth at bedtime as needed for anxiety., Disp: 30 tablet, Rfl: 2   Ascorbic Acid (VITAMIN C) 1000 MG tablet, Take 1,000 mg by mouth daily., Disp: , Rfl:    aspirin  EC 81 MG tablet, Take 1 tablet (81 mg total) by mouth daily. Swallow whole., Disp: 90 tablet, Rfl: 3   Azelastine -Fluticasone  137-50 MCG/ACT SUSP, Can use one to two sprays in each nostril one to two times daily., Disp: 23 g, Rfl: 5   beclomethasone  (QVAR) 40 MCG/ACT inhaler, Inhale 2 puffs into the lungs daily., Disp: 10.6 g, Rfl: 5   cetirizine (ZYRTEC) 10 MG tablet, Take 10 mg by mouth daily., Disp: , Rfl:    cyclobenzaprine  (FLEXERIL ) 5 MG tablet, Take 1-2 tablets (5-10 mg total) by mouth every 8 (eight) hours as needed for muscle spasms., Disp: 30 tablet, Rfl: 2   EPINEPHrine  (EPIPEN  2-PAK) 0.3 mg/0.3 mL IJ SOAJ injection, Inject 0.3 mg into the muscle as needed for anaphylaxis., Disp: 1 each, Rfl: 2   famotidine  (PEPCID ) 40 MG tablet, Take one tablet by mouth every night., Disp: 30 tablet, Rfl: 5   Insulin  Glargine (BASAGLAR  KWIKPEN) 100 UNIT/ML, Please specify directions, refills and quantity, Disp: 100 mL, Rfl: 3   Insulin  Pen Needle (BD PEN NEEDLE NANO 2ND GEN) 32G X 4 MM MISC, 1 each by Other route daily., Disp: , Rfl:    Multiple Vitamin (MULTIVITAMIN WITH MINERALS) TABS tablet, Take 1 tablet by mouth daily., Disp: , Rfl:    Nutritional Supplements (NUTRITIONAL SUPPLEMENT PO), Take by mouth at bedtime. Ashwaganda, Disp: , Rfl:    omeprazole  (PRILOSEC) 40 MG capsule, Take 1 capsule (40 mg total) by mouth in the morning., Disp: 30 capsule, Rfl: 5   ondansetron  (ZOFRAN ) 4 MG tablet, Take 1 tablet (4 mg total) by mouth daily as needed for nausea or vomiting., Disp: 30 tablet, Rfl: 1   tirzepatide  (MOUNJARO ) 12.5 MG/0.5ML Pen, Inject 12.5 mg into the skin once a week., Disp: 2 mL, Rfl: 5   triamcinolone  cream (KENALOG ) 0.1 %, Apply 1 Application topically 2 (two) times daily. (Patient taking differently: Apply 1 Application topically as needed.), Disp: 30 g, Rfl: 5   Vitamin D, Cholecalciferol, 25 MCG (1000 UT) TABS, Take 1,000 Units by mouth daily., Disp: , Rfl:    Zinc  50 MG TABS, Take 50 mg by mouth daily., Disp: , Rfl:    Ferric Maltol  (ACCRUFER ) 30 MG CAPS, Take 1 capsule (30 mg total) by mouth in the morning and at bedtime. (Patient not taking: Reported on 04/20/2024), Disp: 180 capsule, Rfl: 1   Review of Systems Review of Systems   Constitutional:  Negative for chills and fever.  Respiratory:  Negative for shortness of breath.   Cardiovascular:  Negative for chest pain, palpitations and leg swelling.  Gastrointestinal:  Negative for abdominal pain, constipation, diarrhea, nausea and vomiting.  Genitourinary:  Negative for frequency.  Skin:  Negative for itching and rash.  Neurological:  Negative for dizziness, weakness and headaches.  Endo/Heme/Allergies:  Negative for polydipsia.       Objective:    Vitals BP 110/76   Pulse (!) 105   Temp (!) 97.2 F (36.2 C) (Temporal)   Resp 16   Ht 5' 5.5 (1.664 m)  Wt 161 lb 12.8 oz (73.4 kg)   SpO2 99%   BMI 26.52 kg/m    Physical Examination Physical Exam Constitutional:      Appearance: Normal appearance. She is not ill-appearing.  Cardiovascular:     Rate and Rhythm: Normal rate and regular rhythm.     Pulses: Normal pulses.     Heart sounds: No murmur heard.    No friction rub. No gallop.  Pulmonary:     Effort: Pulmonary effort is normal. No respiratory distress.     Breath sounds: No wheezing, rhonchi or rales.  Abdominal:     General: Bowel sounds are normal. There is no distension.     Palpations: Abdomen is soft.     Tenderness: There is no abdominal tenderness.  Musculoskeletal:     Right lower leg: No edema.     Left lower leg: No edema.  Skin:    General: Skin is warm and dry.     Findings: No rash.  Neurological:     Mental Status: She is alert.        Assessment & Plan:   Diabetes mellitus without complication (HCC) We will check a HgBA1c on her today.  She was controlled on her last visit.    Return in about 3 months (around 07/21/2024).   Selinda Fleeta Finger, MD

## 2024-04-20 NOTE — Assessment & Plan Note (Signed)
 We will check a HgBA1c on her today.  She was controlled on her last visit.

## 2024-04-29 ENCOUNTER — Ambulatory Visit: Admitting: Internal Medicine

## 2024-04-29 DIAGNOSIS — E119 Type 2 diabetes mellitus without complications: Secondary | ICD-10-CM

## 2024-04-29 DIAGNOSIS — R109 Unspecified abdominal pain: Secondary | ICD-10-CM | POA: Diagnosis not present

## 2024-04-29 NOTE — Addendum Note (Signed)
 Addended by: LENETTA LACKS on: 04/29/2024 10:28 AM   Modules accepted: Orders

## 2024-04-29 NOTE — Progress Notes (Signed)
 Nurse Visit Lab Draw A1C, CMP, Amylase, Lipase  Collected labs from 04/20/2024

## 2024-04-30 LAB — HEMOGLOBIN A1C
Est. average glucose Bld gHb Est-mCnc: 140 mg/dL
Hgb A1c MFr Bld: 6.5 % — ABNORMAL HIGH (ref 4.8–5.6)

## 2024-04-30 LAB — CMP14 + ANION GAP
ALT: 11 IU/L (ref 0–32)
AST: 16 IU/L (ref 0–40)
Albumin: 4.6 g/dL (ref 3.9–4.9)
Alkaline Phosphatase: 54 IU/L (ref 41–116)
Anion Gap: 14 mmol/L (ref 10.0–18.0)
BUN/Creatinine Ratio: 15 (ref 9–23)
BUN: 9 mg/dL (ref 6–24)
Bilirubin Total: 0.3 mg/dL (ref 0.0–1.2)
CO2: 20 mmol/L (ref 20–29)
Calcium: 9.4 mg/dL (ref 8.7–10.2)
Chloride: 102 mmol/L (ref 96–106)
Creatinine, Ser: 0.61 mg/dL (ref 0.57–1.00)
Globulin, Total: 2.6 g/dL (ref 1.5–4.5)
Glucose: 130 mg/dL — ABNORMAL HIGH (ref 70–99)
Potassium: 4.5 mmol/L (ref 3.5–5.2)
Sodium: 136 mmol/L (ref 134–144)
Total Protein: 7.2 g/dL (ref 6.0–8.5)
eGFR: 112 mL/min/1.73 (ref >59)

## 2024-04-30 LAB — AMYLASE: Amylase: 250 U/L — ABNORMAL HIGH (ref 31–110)

## 2024-04-30 LAB — LIPASE: Lipase: 584 U/L — ABNORMAL HIGH (ref 14–72)

## 2024-05-03 ENCOUNTER — Other Ambulatory Visit: Payer: Self-pay

## 2024-05-03 ENCOUNTER — Ambulatory Visit: Payer: Self-pay

## 2024-05-03 ENCOUNTER — Ambulatory Visit: Admitting: Internal Medicine

## 2024-05-03 DIAGNOSIS — R109 Unspecified abdominal pain: Secondary | ICD-10-CM

## 2024-05-03 MED ORDER — OMEPRAZOLE 40 MG PO CPDR: 40.0000 mg | DELAYED_RELEASE_CAPSULE | Freq: Two times a day (BID) | ORAL | 5 refills | Status: AC

## 2024-05-03 NOTE — Progress Notes (Signed)
 Nurse Visit Lab draw CMP, Amylase, Lipase

## 2024-05-03 NOTE — Progress Notes (Signed)
 Patient called.  Patient aware.  Per VE  She needs to come in this week to repeat her liver enzymes.

## 2024-05-04 LAB — CMP14 + ANION GAP
ALT: 11 IU/L (ref 0–32)
AST: 16 IU/L (ref 0–40)
Albumin: 4.6 g/dL (ref 3.9–4.9)
Alkaline Phosphatase: 50 IU/L (ref 41–116)
Anion Gap: 13 mmol/L (ref 10.0–18.0)
BUN/Creatinine Ratio: 16 (ref 9–23)
BUN: 11 mg/dL (ref 6–24)
Bilirubin Total: 0.4 mg/dL (ref 0.0–1.2)
CO2: 20 mmol/L (ref 20–29)
Calcium: 9.2 mg/dL (ref 8.7–10.2)
Chloride: 105 mmol/L (ref 96–106)
Creatinine, Ser: 0.67 mg/dL (ref 0.57–1.00)
Globulin, Total: 2.7 g/dL (ref 1.5–4.5)
Glucose: 114 mg/dL — ABNORMAL HIGH (ref 70–99)
Potassium: 4.6 mmol/L (ref 3.5–5.2)
Sodium: 138 mmol/L (ref 134–144)
Total Protein: 7.3 g/dL (ref 6.0–8.5)
eGFR: 110 mL/min/1.73 (ref >59)

## 2024-05-04 LAB — AMYLASE: Amylase: 91 U/L (ref 31–110)

## 2024-05-04 LAB — LIPASE: Lipase: 69 U/L (ref 14–72)

## 2024-07-09 ENCOUNTER — Ambulatory Visit: Admitting: Neurology

## 2024-07-09 ENCOUNTER — Encounter: Payer: Self-pay | Admitting: Neurology

## 2024-07-09 VITALS — BP 113/74 | HR 101 | Ht 60.05 in | Wt 158.0 lb

## 2024-07-09 DIAGNOSIS — M542 Cervicalgia: Secondary | ICD-10-CM

## 2024-07-09 NOTE — Patient Instructions (Addendum)
-  Physical therapy referral for your neck. Someone will call to schedule. If you do not hear from someone in 1-2 weeks, let us  know.  -Continue Flexeril  5-10 mg as needed  If this does not help, let me know and we may consider a referral to sports medicine to see what other ideas they have for your neck tightness and pain.  If you have new difficulty speaking, face droop, numbness on one side of the body, weakness on one side of the body, or dizziness/imbalance, this could be the sign of a stroke. Don't wait, please call EMS and be evaluated at the nearest emergency room.   The physicians and staff at [facility] are committed to providing excellent care. You may receive a survey requesting feedback about your experience at our office. We strive to receive very good responses to the survey questions. If you feel that your experience would prevent you from giving the office a very good  response, please contact our office to try to remedy the situation. We may be reached at [phone]. Thank you for taking the time out of your busy day to complete the survey.  Venetia Potters, MD [facility]

## 2024-07-19 ENCOUNTER — Ambulatory Visit: Admitting: Internal Medicine

## 2024-07-19 ENCOUNTER — Ambulatory Visit

## 2024-08-23 ENCOUNTER — Ambulatory Visit: Admitting: Allergy and Immunology
